# Patient Record
Sex: Female | Born: 1955 | Race: White | Hispanic: No | Smoking: Current every day smoker
Health system: Southern US, Community
[De-identification: ages and names within clinical notes are randomized; demographics above are authoritative.]

## PROBLEM LIST (undated history)

## (undated) DIAGNOSIS — F419 Anxiety disorder, unspecified: Secondary | ICD-10-CM

## (undated) DIAGNOSIS — F329 Major depressive disorder, single episode, unspecified: Secondary | ICD-10-CM

## (undated) DIAGNOSIS — F1910 Other psychoactive substance abuse, uncomplicated: Secondary | ICD-10-CM

## (undated) DIAGNOSIS — M549 Dorsalgia, unspecified: Secondary | ICD-10-CM

## (undated) DIAGNOSIS — F32A Depression, unspecified: Secondary | ICD-10-CM

## (undated) DIAGNOSIS — I10 Essential (primary) hypertension: Secondary | ICD-10-CM

## (undated) DIAGNOSIS — J449 Chronic obstructive pulmonary disease, unspecified: Secondary | ICD-10-CM

## (undated) HISTORY — PX: DILATION AND CURETTAGE OF UTERUS: SHX78

## (undated) HISTORY — DX: Chronic obstructive pulmonary disease, unspecified: J44.9

## (undated) HISTORY — DX: Depression, unspecified: F32.A

## (undated) HISTORY — DX: Other psychoactive substance abuse, uncomplicated: F19.10

## (undated) HISTORY — DX: Dorsalgia, unspecified: M54.9

## (undated) HISTORY — DX: Major depressive disorder, single episode, unspecified: F32.9

## (undated) HISTORY — DX: Anxiety disorder, unspecified: F41.9

---

## 2002-09-28 ENCOUNTER — Encounter: Payer: Self-pay | Admitting: Emergency Medicine

## 2002-09-28 ENCOUNTER — Emergency Department (HOSPITAL_COMMUNITY): Admission: EM | Admit: 2002-09-28 | Discharge: 2002-09-28 | Payer: Self-pay | Admitting: Emergency Medicine

## 2006-11-04 ENCOUNTER — Ambulatory Visit (HOSPITAL_COMMUNITY): Admission: RE | Admit: 2006-11-04 | Discharge: 2006-11-04 | Payer: Self-pay | Admitting: Family Medicine

## 2006-12-18 ENCOUNTER — Emergency Department (HOSPITAL_COMMUNITY): Admission: EM | Admit: 2006-12-18 | Discharge: 2006-12-18 | Payer: Self-pay | Admitting: Emergency Medicine

## 2006-12-22 ENCOUNTER — Emergency Department (HOSPITAL_COMMUNITY): Admission: EM | Admit: 2006-12-22 | Discharge: 2006-12-22 | Payer: Self-pay | Admitting: Emergency Medicine

## 2007-07-21 ENCOUNTER — Emergency Department (HOSPITAL_COMMUNITY): Admission: EM | Admit: 2007-07-21 | Discharge: 2007-07-22 | Payer: Self-pay | Admitting: Emergency Medicine

## 2008-02-19 ENCOUNTER — Emergency Department (HOSPITAL_COMMUNITY): Admission: EM | Admit: 2008-02-19 | Discharge: 2008-02-19 | Payer: Self-pay | Admitting: Emergency Medicine

## 2008-06-04 ENCOUNTER — Ambulatory Visit (HOSPITAL_COMMUNITY): Admission: RE | Admit: 2008-06-04 | Discharge: 2008-06-04 | Payer: Self-pay | Admitting: Internal Medicine

## 2008-06-07 ENCOUNTER — Ambulatory Visit (HOSPITAL_COMMUNITY): Admission: RE | Admit: 2008-06-07 | Discharge: 2008-06-07 | Payer: Self-pay | Admitting: Internal Medicine

## 2008-06-10 ENCOUNTER — Encounter (INDEPENDENT_AMBULATORY_CARE_PROVIDER_SITE_OTHER): Payer: Self-pay | Admitting: Internal Medicine

## 2008-06-10 ENCOUNTER — Ambulatory Visit (HOSPITAL_COMMUNITY): Admission: RE | Admit: 2008-06-10 | Discharge: 2008-06-10 | Payer: Self-pay | Admitting: Internal Medicine

## 2009-04-02 ENCOUNTER — Ambulatory Visit: Payer: Self-pay

## 2010-07-07 ENCOUNTER — Emergency Department (HOSPITAL_COMMUNITY)
Admission: EM | Admit: 2010-07-07 | Discharge: 2010-07-07 | Payer: Self-pay | Source: Home / Self Care | Admitting: Emergency Medicine

## 2010-09-14 ENCOUNTER — Ambulatory Visit (HOSPITAL_COMMUNITY)
Admission: RE | Admit: 2010-09-14 | Discharge: 2010-09-14 | Payer: Self-pay | Source: Home / Self Care | Admitting: Psychiatry

## 2011-01-17 ENCOUNTER — Encounter: Payer: Self-pay | Admitting: Internal Medicine

## 2011-03-24 ENCOUNTER — Encounter: Payer: Self-pay | Admitting: Physician Assistant

## 2011-03-24 DIAGNOSIS — F32A Depression, unspecified: Secondary | ICD-10-CM

## 2011-03-24 DIAGNOSIS — F329 Major depressive disorder, single episode, unspecified: Secondary | ICD-10-CM | POA: Insufficient documentation

## 2011-03-24 DIAGNOSIS — M549 Dorsalgia, unspecified: Secondary | ICD-10-CM

## 2011-03-24 DIAGNOSIS — G8929 Other chronic pain: Secondary | ICD-10-CM

## 2011-08-18 ENCOUNTER — Other Ambulatory Visit: Payer: Self-pay | Admitting: Family Medicine

## 2011-08-18 DIAGNOSIS — N63 Unspecified lump in unspecified breast: Secondary | ICD-10-CM

## 2011-08-18 DIAGNOSIS — N649 Disorder of breast, unspecified: Secondary | ICD-10-CM

## 2011-08-26 ENCOUNTER — Other Ambulatory Visit: Payer: Self-pay

## 2011-09-02 ENCOUNTER — Ambulatory Visit
Admission: RE | Admit: 2011-09-02 | Discharge: 2011-09-02 | Disposition: A | Discharge status: Home / Self Care | Payer: Medicaid Other | Source: Ambulatory Visit | Attending: Family Medicine | Admitting: Family Medicine

## 2011-09-02 DIAGNOSIS — N63 Unspecified lump in unspecified breast: Secondary | ICD-10-CM

## 2011-09-02 DIAGNOSIS — N649 Disorder of breast, unspecified: Secondary | ICD-10-CM

## 2011-09-17 LAB — CBC
HCT: 35.1 — ABNORMAL LOW
Hemoglobin: 12.1
MCHC: 34.5
RBC: 4.14
RDW: 16.7 — ABNORMAL HIGH

## 2011-09-17 LAB — DIFFERENTIAL
Basophils Absolute: 0.1
Basophils Relative: 1
Eosinophils Relative: 3
Lymphocytes Relative: 30
Monocytes Absolute: 0.8
Monocytes Relative: 8
Neutro Abs: 6

## 2011-09-17 LAB — BASIC METABOLIC PANEL
CO2: 27
Chloride: 108
GFR calc Af Amer: >60
Glucose, Bld: 96
Potassium: 4.5
Sodium: 139

## 2011-10-11 LAB — CBC
MCHC: 31.8
Platelets: 352
RDW: 19.6 — ABNORMAL HIGH

## 2011-10-11 LAB — DIFFERENTIAL
Basophils Absolute: 0.1
Basophils Relative: 1
Lymphocytes Relative: 28
Neutro Abs: 9.4 — ABNORMAL HIGH
Neutrophils Relative %: 61

## 2011-10-11 LAB — BASIC METABOLIC PANEL
CO2: 29
Calcium: 9.2
Creatinine, Ser: 2.28 — ABNORMAL HIGH
GFR calc non Af Amer: 23 — ABNORMAL LOW
Glucose, Bld: 83
Sodium: 141

## 2011-10-11 LAB — RAPID URINE DRUG SCREEN, HOSP PERFORMED
Amphetamines: NOT DETECTED
Cocaine: NOT DETECTED
Opiates: POSITIVE — AB
Tetrahydrocannabinol: NOT DETECTED

## 2011-10-11 LAB — ACETAMINOPHEN LEVEL: Acetaminophen (Tylenol), Serum: <10 — ABNORMAL LOW

## 2011-10-11 LAB — SALICYLATE LEVEL: Salicylate Lvl: <4

## 2012-06-07 ENCOUNTER — Emergency Department (HOSPITAL_COMMUNITY): Payer: Medicaid Other

## 2012-06-07 ENCOUNTER — Encounter (HOSPITAL_COMMUNITY): Payer: Self-pay | Admitting: Emergency Medicine

## 2012-06-07 ENCOUNTER — Emergency Department (HOSPITAL_COMMUNITY)
Admission: EM | Admit: 2012-06-07 | Discharge: 2012-06-07 | Disposition: A | Discharge status: Home / Self Care | Payer: Medicaid Other | Attending: Emergency Medicine | Admitting: Emergency Medicine

## 2012-06-07 DIAGNOSIS — F17200 Nicotine dependence, unspecified, uncomplicated: Secondary | ICD-10-CM | POA: Insufficient documentation

## 2012-06-07 DIAGNOSIS — F341 Dysthymic disorder: Secondary | ICD-10-CM | POA: Insufficient documentation

## 2012-06-07 DIAGNOSIS — Z79899 Other long term (current) drug therapy: Secondary | ICD-10-CM | POA: Insufficient documentation

## 2012-06-07 DIAGNOSIS — J4489 Other specified chronic obstructive pulmonary disease: Secondary | ICD-10-CM | POA: Insufficient documentation

## 2012-06-07 DIAGNOSIS — F1910 Other psychoactive substance abuse, uncomplicated: Secondary | ICD-10-CM | POA: Insufficient documentation

## 2012-06-07 DIAGNOSIS — J449 Chronic obstructive pulmonary disease, unspecified: Secondary | ICD-10-CM | POA: Insufficient documentation

## 2012-06-07 DIAGNOSIS — T450X5A Adverse effect of antiallergic and antiemetic drugs, initial encounter: Secondary | ICD-10-CM | POA: Insufficient documentation

## 2012-06-07 LAB — URINALYSIS, ROUTINE W REFLEX MICROSCOPIC
Bilirubin Urine: NEGATIVE
Glucose, UA: NEGATIVE mg/dL
Hgb urine dipstick: NEGATIVE
Specific Gravity, Urine: 1.017 (ref 1.005–1.030)
pH: 5.5 (ref 5.0–8.0)

## 2012-06-07 LAB — SALICYLATE LEVEL: Salicylate Lvl: <2 mg/dL — ABNORMAL LOW (ref 2.8–20.0)

## 2012-06-07 LAB — URINE MICROSCOPIC-ADD ON

## 2012-06-07 LAB — RAPID URINE DRUG SCREEN, HOSP PERFORMED
Amphetamines: NOT DETECTED
Benzodiazepines: POSITIVE — AB
Cocaine: NOT DETECTED
Opiates: NOT DETECTED
Tetrahydrocannabinol: NOT DETECTED

## 2012-06-07 LAB — COMPREHENSIVE METABOLIC PANEL
ALT: 52 U/L — ABNORMAL HIGH (ref 0–35)
AST: 37 U/L (ref 0–37)
Alkaline Phosphatase: 106 U/L (ref 39–117)
CO2: 26 mEq/L (ref 19–32)
Calcium: 9.4 mg/dL (ref 8.4–10.5)
Chloride: 103 mEq/L (ref 96–112)
GFR calc non Af Amer: 59 mL/min — ABNORMAL LOW (ref >90)
Glucose, Bld: 75 mg/dL (ref 70–99)
Potassium: 4.3 mEq/L (ref 3.5–5.1)
Sodium: 140 mEq/L (ref 135–145)

## 2012-06-07 LAB — CBC
Hemoglobin: 12.7 g/dL (ref 12.0–15.0)
MCH: 28 pg (ref 26.0–34.0)
Platelets: 282 10*3/uL (ref 150–400)
RBC: 4.53 MIL/uL (ref 3.87–5.11)
WBC: 9 10*3/uL (ref 4.0–10.5)

## 2012-06-07 MED ORDER — TRAZODONE HCL 150 MG PO TABS
150.0000 mg | ORAL_TABLET | Freq: Every day | ORAL | Status: DC
Start: 2012-06-07 — End: 2012-06-07
  Filled 2012-06-07: qty 1

## 2012-06-07 MED ORDER — CITALOPRAM HYDROBROMIDE 20 MG PO TABS
20.0000 mg | ORAL_TABLET | Freq: Every day | ORAL | Status: DC
  Administered 2012-06-07: 20 mg via ORAL
  Filled 2012-06-07: qty 1

## 2012-06-07 MED ORDER — FLUOXETINE HCL 20 MG PO CAPS: 60.0000 mg | ORAL_CAPSULE | Freq: Every day | ORAL | Status: DC

## 2012-06-07 MED ORDER — LORAZEPAM 1 MG PO TABS: 1.0000 mg | ORAL_TABLET | Freq: Once | ORAL | Status: DC

## 2012-06-07 MED ORDER — IBUPROFEN 200 MG PO TABS: 600.0000 mg | ORAL_TABLET | Freq: Three times a day (TID) | ORAL | Status: DC | PRN

## 2012-06-07 MED ORDER — LORAZEPAM 1 MG PO TABS: 1.0000 mg | ORAL_TABLET | Freq: Three times a day (TID) | ORAL | Status: DC | PRN

## 2012-06-07 NOTE — ED Notes (Signed)
Asked pt for urine sample. Said she was unable to go at the moment and will try later
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
## 2012-06-07 NOTE — ED Notes (Signed)
Pt requests detox from OTC sleep aid; pt sts she takes 5 pills TID because she sts they gave her energy; pt denies SI/HI; pt unsure of what ingredient is in sleep aid

## 2012-06-07 NOTE — Discharge Instructions (Signed)
Chemical Dependency Stop using the sleep aid.  Follow up with your doctor.  Return to the ED if you develop new or worsening symptoms. Chemical dependency is an addiction to drugs or alcohol. It is characterized by the repeated behavior of seeking out and using drugs and alcohol despite harmful consequences to the health and safety of ones self and others.  RISK FACTORS There are certain situations or behaviors that increase a person's risk for chemical dependency. These include:  A family history of chemical dependency.   A history of mental health issues, including depression and anxiety.   A home environment where drugs and alcohol are easily available to you.   Drug or alcohol use at a young age.  SYMPTOMS  The following symptoms can indicate chemical dependency:  Inability to limit the use of drugs or alcohol.   Nausea, sweating, shakiness, and anxiety that occurs when alcohol or drugs are not being used.   An increase in amount of drugs or alcohol that is necessary to get drunk or high.  People who experience these symptoms can assess their use of drugs and alcohol by asking themselves the following questions:  Have you been told by friends or family that they are worried about your use of alcohol or drugs?   Do friends and family ever tell you about things you did while drinking alcohol or using drugs that you do not remember?   Do you lie about using alcohol or drugs or about the amounts you use?   Do you have difficulty completing daily tasks unless you use alcohol or drugs?   Is the level of your work or school performance lower because of your drug or alcohol use?   Do you get sick from using drugs or alcohol but keep using anyway?   Do you feel uncomfortable in social situations unless you use alcohol or drugs?   Do you use drugs or alcohol to help forget problems?  An answer of yes to any of these questions may indicate chemical dependency. Professional evaluation  is suggested. Document Released: 12/07/2001 Document Revised: 12/02/2011 Document Reviewed: 02/18/2011 [facility] Patient Information 2012 [place], [place].

## 2012-06-07 NOTE — ED Notes (Signed)
Pt reports wants detox from OTC sleeping pill. States takes sleeping pill because it gives her energy. Pt denies SI/HI. Denies depression.

## 2012-06-07 NOTE — BH Assessment (Signed)
[facility] Assessment Progress Note      1730 Spoke with pt's live-in boyfriend, Altha Harm, who clarified some of her information.  Pt sees Rudi Heap at [facility].  Her Psychiatrist is Donnie Aho, at [facility], who is treating her with Prozac and Klonopin.  According to Mr. Delford Field, pt also takes Tramadol and Trazadone, prescribed by her PCP.  He further states that she eats "those Equate pills like M and M's and has been getting progressively worse with the addiction.  She has recently even forgotten her grandchildren's names."
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
## 2012-06-07 NOTE — ED Notes (Signed)
Tele-pysch consult completed

## 2012-06-07 NOTE — ED Notes (Signed)
Report given to [facility], RN on yellow.  PTAR to pick up the patient that is in that room-will move when room is clean.

## 2012-06-07 NOTE — ED Notes (Signed)
Tele-pysch monitor in room and ready for consult

## 2012-06-12 ENCOUNTER — Other Ambulatory Visit: Payer: Self-pay | Admitting: Physician Assistant

## 2012-06-12 ENCOUNTER — Ambulatory Visit
Admission: RE | Admit: 2012-06-12 | Discharge: 2012-06-12 | Disposition: A | Discharge status: Home / Self Care | Payer: Medicaid Other | Source: Ambulatory Visit | Attending: Physician Assistant | Admitting: Physician Assistant

## 2012-06-12 DIAGNOSIS — R1031 Right lower quadrant pain: Secondary | ICD-10-CM

## 2012-06-12 MED ORDER — IOHEXOL 300 MG/ML  SOLN
100.0000 mL | Freq: Once | INTRAMUSCULAR | Status: AC | PRN
  Administered 2012-06-12: 100 mL via INTRAVENOUS

## 2012-06-12 MED ORDER — IOHEXOL 300 MG/ML  SOLN
20.0000 mL | Freq: Once | INTRAMUSCULAR | Status: AC | PRN
  Administered 2012-06-12: 20 mL via ORAL

## 2012-06-30 ENCOUNTER — Encounter: Payer: Self-pay | Admitting: Obstetrics and Gynecology

## 2012-06-30 ENCOUNTER — Ambulatory Visit (INDEPENDENT_AMBULATORY_CARE_PROVIDER_SITE_OTHER): Payer: Medicaid Other | Admitting: Obstetrics and Gynecology

## 2012-06-30 VITALS — BP 124/76 | Temp 97.7°F | Resp 16 | Ht 61.0 in | Wt 189.0 lb

## 2012-06-30 DIAGNOSIS — IMO0002 Reserved for concepts with insufficient information to code with codable children: Secondary | ICD-10-CM

## 2012-06-30 DIAGNOSIS — R87810 Cervical high risk human papillomavirus (HPV) DNA test positive: Secondary | ICD-10-CM

## 2012-06-30 DIAGNOSIS — R109 Unspecified abdominal pain: Secondary | ICD-10-CM

## 2012-06-30 DIAGNOSIS — R87811 Vaginal high risk human papillomavirus (HPV) DNA test positive: Secondary | ICD-10-CM

## 2012-06-30 LAB — POCT URINALYSIS DIPSTICK
Glucose, UA: NEGATIVE
Nitrite, UA: NEGATIVE
Protein, UA: NEGATIVE
Urobilinogen, UA: NEGATIVE

## 2012-06-30 LAB — POCT URINE PREGNANCY: Preg Test, Ur: NEGATIVE

## 2012-06-30 NOTE — Progress Notes (Signed)
Patient ID: Makayla Lin, female   DOB: Feb 17, 1956, 56 y.o.   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 Previous colposcopy: normal exam without visible pathology . Prior cervical treatment: no treatment.  Procedure Details  The risks and benefits of the procedure and Written informed consent obtained.  Patient given 600 mg Ibuprofen.  Speculum placed in vagina and excellent visualization of cervix achieved, cervix swabbed x 3 with acetic acid solution.  Findings: Cervix: no visible lesions and no mosaicism; endocervical curettage performed. Vaginal inspection: normal without visible lesions. Vulvar colposcopy: normal mucosa without lesions.  Specimens: ECC  Complications: none. Pt c/o RLQ pain for a month.  Getting into and out of the car makes it worse.  No f/c/n/v.  No vaginal bleeding or cramping.    Pt still has her appendix  Plan: Specimens labelled and sent to Pathology. HRHPV ro 16 and 18 were negative Return to discuss Pathology results in 2 weeks. US@[place]   Melody Comas AMD

## 2012-06-30 NOTE — Progress Notes (Signed)
Last Pap: 04/2012   WNL: No........Marland KitchenEpithelial cell abnormality: Squamous cells..Atypical Squamous cells of undetermined significance (ASC-US) HPV detected. Regular Periods:no Contraception: None  Monthly Breast exam:yes Tetanus<56yrs:yes Nl.Bladder Function:yes Daily BMs:yes Healthy Diet:yes Calcium:yes Mammogram:yes Date of Mammogram: Pt states it was this yr but does not recall month or date Exercise:yes Have often Exercise: Everyday/ walks Seatbelt: yes Abuse at home: no Stressful work:no Sigmoid-colonoscopy: Never had Bone Density: No PCP: [facility] Change in PMH: None Change in [facility]: Paternal & mother and father passed of cancer  *C/O abd pain on right lower quadrant  *Pt gets Rx filled through her psychiatrist

## 2012-07-04 LAB — PATHOLOGY

## 2012-07-14 ENCOUNTER — Other Ambulatory Visit: Payer: Medicaid Other

## 2012-07-14 ENCOUNTER — Encounter: Payer: Medicaid Other | Admitting: Obstetrics and Gynecology

## 2012-07-25 ENCOUNTER — Other Ambulatory Visit: Payer: Self-pay | Admitting: Family Medicine

## 2012-07-25 DIAGNOSIS — Z1231 Encounter for screening mammogram for malignant neoplasm of breast: Secondary | ICD-10-CM

## 2012-08-07 ENCOUNTER — Ambulatory Visit
Admission: RE | Admit: 2012-08-07 | Discharge: 2012-08-07 | Disposition: A | Discharge status: Home / Self Care | Payer: Medicaid Other | Source: Ambulatory Visit | Attending: Neurology | Admitting: Neurology

## 2012-08-07 ENCOUNTER — Other Ambulatory Visit: Payer: Self-pay | Admitting: Neurology

## 2012-08-07 DIAGNOSIS — S0990XA Unspecified injury of head, initial encounter: Secondary | ICD-10-CM

## 2012-08-07 DIAGNOSIS — R413 Other amnesia: Secondary | ICD-10-CM

## 2012-09-04 ENCOUNTER — Ambulatory Visit: Payer: Medicaid Other

## 2012-09-18 ENCOUNTER — Ambulatory Visit: Payer: Medicaid Other

## 2013-02-06 ENCOUNTER — Encounter (HOSPITAL_COMMUNITY): Payer: Self-pay | Admitting: Emergency Medicine

## 2013-02-06 ENCOUNTER — Emergency Department (HOSPITAL_COMMUNITY)
Admission: EM | Admit: 2013-02-06 | Discharge: 2013-02-06 | Disposition: A | Discharge status: Home / Self Care | Payer: Medicaid Other | Source: Home / Self Care | Attending: Family Medicine | Admitting: Family Medicine

## 2013-02-06 DIAGNOSIS — Z23 Encounter for immunization: Secondary | ICD-10-CM

## 2013-02-06 DIAGNOSIS — S8010XA Contusion of unspecified lower leg, initial encounter: Secondary | ICD-10-CM

## 2013-02-06 DIAGNOSIS — S8012XA Contusion of left lower leg, initial encounter: Secondary | ICD-10-CM

## 2013-02-06 DIAGNOSIS — S20219A Contusion of unspecified front wall of thorax, initial encounter: Secondary | ICD-10-CM

## 2013-02-06 HISTORY — DX: Essential (primary) hypertension: I10

## 2013-02-06 MED ORDER — TETANUS-DIPHTH-ACELL PERTUSSIS 5-2.5-18.5 LF-MCG/0.5 IM SUSP
INTRAMUSCULAR | Status: AC
  Filled 2013-02-06: qty 0.5

## 2013-02-06 MED ORDER — TETANUS-DIPHTH-ACELL PERTUSSIS 5-2.5-18.5 LF-MCG/0.5 IM SUSP
0.5000 mL | Freq: Once | INTRAMUSCULAR | Status: AC
  Administered 2013-02-06: 0.5 mL via INTRAMUSCULAR

## 2013-02-06 NOTE — ED Notes (Signed)
Pt states that on 2/7 she fell flat on concreat c/o left leg pain and some abrasion noted to below the knee of left leg.   Pt is also c/o of sob for about a month with wheezing and chest pain off and on. Pt states that she had been around family member with pneumonia.   No hx of asthma.
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
## 2013-02-24 ENCOUNTER — Encounter (HOSPITAL_COMMUNITY): Payer: Self-pay | Admitting: Emergency Medicine

## 2013-02-24 ENCOUNTER — Emergency Department (HOSPITAL_COMMUNITY)
Admission: EM | Admit: 2013-02-24 | Discharge: 2013-02-24 | Disposition: A | Discharge status: Home / Self Care | Payer: Medicaid Other | Source: Home / Self Care | Attending: Emergency Medicine | Admitting: Emergency Medicine

## 2013-02-24 DIAGNOSIS — IMO0002 Reserved for concepts with insufficient information to code with codable children: Secondary | ICD-10-CM

## 2013-02-24 DIAGNOSIS — T148XXA Other injury of unspecified body region, initial encounter: Secondary | ICD-10-CM

## 2013-02-24 NOTE — ED Notes (Signed)
Pt is here for laceration to left thumb since 1115 today Straight laceration about 2cm on left thumb Had her tetanus shot about 2 weeks ago Bleeding controlled w/dressing  She is alert w/no signs of acute distress.

## 2013-02-24 NOTE — ED Notes (Signed)
Asked to waiting room to place a dressing on patients finger while she was waiting.  Patient has laceration to thumb.  Bleeding currently controled.  Bulky dressing placed on thumb.  Patient and husband made aware to let registration staff know if anything changed

## 2013-02-24 NOTE — ED Provider Notes (Signed)
Chief Complaint  Patient presents with  . Extremity Laceration    History of Present Illness:   Makayla Lin is a 57 year old female who lacerated her left thumb with a knife today at around 1:15 AM. The knife was clean and sharp. She lacerated the dorsum of the thumb, just distal to the inner phalangeal joint. She's able to fully extend the joint and fully flex. She denies any numbness or tingling. Her last tetanus shot was just a couple weeks ago.  Review of Systems:  Other than noted above, the patient denies any of the following symptoms: Systemic:  No fever or chills. Musculoskeletal:  No joint pain or decreased range of motion. Neuro:  No numbness, tingling, or weakness.  PMFSH:  Past medical history, family history, social history, meds, and allergies were reviewed.  Physical Exam:   Vital signs:  BP 167/103  Pulse 90  Temp(Src) 98.2 F (36.8 C) (Oral)  Resp 19  SpO2 100% Ext:  There is a 2 cm laceration on the dorsum of the thumb, just distal to the inner phalangeal joint on the dorsum. The tendon was not exposed. She able to fully extend and fully flex but does hurt to flex she has intact sensation of the tip of the thumb.  All other joints had a full ROM without pain.  Pulses were full.  Good capillary refill in all digits.  No edema. Neurological:  Alert and oriented.  No muscle weakness.  Sensation was intact to light touch.   Procedure: Verbal informed consent was obtained.  The patient was informed of the risks and benefits of the procedure and understands and accepts.  Identity of the patient was verified verbally and by wristband.   The laceration area described above was prepped with Betadine and saline  and anesthetized with 3 mL of 2% Xylocaine without epinephrine.  The wound was then closed as follows:  The wound edges were approximated with 5 simple, interrupted 5-0 nylon sutures.  There were no immediate complications, and the patient tolerated the procedure well.  The laceration was then cleansed, Bacitracin ointment was applied and a clean, dry pressure dressing was put on.   Assessment:  The encounter diagnosis was Laceration.  Plan:   1.  The following meds were prescribed:   Discharge Medication List as of 02/24/2013  1:47 PM     2.  The patient was instructed in wound care and pain control, and handouts were given. 3.  The patient was told to return in 14 days for suture removal or wound recheck or sooner if any sign of infection.     Reuben Likes, MD 02/24/13 [phone]

## 2013-03-13 ENCOUNTER — Ambulatory Visit: Payer: Self-pay | Admitting: Neurology

## 2013-03-29 ENCOUNTER — Telehealth: Payer: Self-pay | Admitting: Physician Assistant

## 2013-03-29 NOTE — Telephone Encounter (Signed)
Last reilff 03/13/13.  Pt has been told only #180/month.  Refill is WAY to early.  Sent back to pharmacy DENIED.

## 2013-04-10 ENCOUNTER — Telehealth: Payer: Self-pay | Admitting: Physician Assistant

## 2013-04-10 MED ORDER — TRAMADOL HCL 50 MG PO TABS: 50.0000 mg | ORAL_TABLET | Freq: Three times a day (TID) | ORAL | Status: DC | PRN

## 2013-04-10 NOTE — Telephone Encounter (Signed)
Last refill 03/12/13.  Last OV 11/20/12.  Tramadol 50mg   1-2 tabs Q8hr prn  #180. Need approval for controlled medication.

## 2013-04-10 NOTE — Telephone Encounter (Signed)
Approved.  #180+0. 

## 2013-04-10 NOTE — Telephone Encounter (Signed)
Medication refilled per protocol. 

## 2013-04-18 ENCOUNTER — Ambulatory Visit (INDEPENDENT_AMBULATORY_CARE_PROVIDER_SITE_OTHER): Payer: Medicaid Other | Admitting: Physician Assistant

## 2013-04-18 ENCOUNTER — Encounter: Payer: Self-pay | Admitting: Physician Assistant

## 2013-04-18 VITALS — BP 134/88 | HR 84 | Temp 98.3°F | Resp 18 | Ht 61.75 in | Wt 198.0 lb

## 2013-04-18 DIAGNOSIS — I10 Essential (primary) hypertension: Secondary | ICD-10-CM

## 2013-04-18 DIAGNOSIS — G8929 Other chronic pain: Secondary | ICD-10-CM

## 2013-04-18 DIAGNOSIS — M549 Dorsalgia, unspecified: Secondary | ICD-10-CM
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
## 2013-05-01 ENCOUNTER — Telehealth: Payer: Self-pay | Admitting: Physician Assistant

## 2013-05-02 NOTE — Telephone Encounter (Signed)
?   OK to Refill  

## 2013-05-03 MED ORDER — TRAMADOL HCL 50 MG PO TABS: 50.0000 mg | ORAL_TABLET | Freq: Three times a day (TID) | ORAL | Status: DC | PRN

## 2013-05-03 NOTE — Telephone Encounter (Signed)
Approved.  #180+0. 

## 2013-05-03 NOTE — Telephone Encounter (Signed)
Refill called out

## 2013-05-16 ENCOUNTER — Encounter: Payer: Medicaid Other | Admitting: Physician Assistant

## 2013-06-05 ENCOUNTER — Telehealth: Payer: Self-pay | Admitting: Physician Assistant

## 2013-06-06 MED ORDER — TRAMADOL HCL 50 MG PO TABS: 50.0000 mg | ORAL_TABLET | Freq: Three times a day (TID) | ORAL | Status: DC | PRN

## 2013-06-06 NOTE — Telephone Encounter (Signed)
Refill appropriate.  #180 called into pharmacy No refills

## 2013-07-30 ENCOUNTER — Telehealth: Payer: Self-pay | Admitting: Physician Assistant

## 2013-07-30 NOTE — Telephone Encounter (Signed)
At LOV 04/18/13 I did referral to pain clinic.  Did she go?  Pain Clinic to manage her pain.  I am NOT going to cont to Rx pain meds-to go to pain clinic.

## 2013-07-30 NOTE — Telephone Encounter (Signed)
Invalid phone number.  Unable to reach patient.  Had appt in May for pain clinic.

## 2013-07-30 NOTE — Telephone Encounter (Signed)
OK refill Tramadol??  Not sure what is being done based on last OV

## 2013-08-01 ENCOUNTER — Encounter: Payer: Medicaid Other | Admitting: Physician Assistant

## 2013-08-01 ENCOUNTER — Telehealth: Payer: Self-pay | Admitting: Family Medicine

## 2013-08-01 NOTE — Telephone Encounter (Signed)
Pt called about refill of Tramadol.  Is completely out of.  I mentioned she missed her CPE appt today.  Apparently she had forgotten about it.  I also asked her about her pain management provider.  She says she is seeing a therapist but not a pain doctor.  Referral Q shows pain mgmt appt in early May but patient states she doesn't know anything about that.  I rescheduled her appt for CPE next week.  Told her I will need to check with provider again about refill of pain medication.Marland Kitchen

## 2013-08-02 NOTE — Telephone Encounter (Signed)
No Pain meds now. NTBS.

## 2013-08-02 NOTE — Telephone Encounter (Signed)
Pt made aware no refill of pain med.  Will discuss with provider at appt on Monday

## 2013-08-06 ENCOUNTER — Encounter: Payer: Self-pay | Admitting: Physician Assistant

## 2013-08-06 ENCOUNTER — Ambulatory Visit (INDEPENDENT_AMBULATORY_CARE_PROVIDER_SITE_OTHER): Payer: Medicaid Other | Admitting: Physician Assistant

## 2013-08-06 VITALS — BP 142/96 | HR 88 | Temp 98.2°F | Resp 18 | Ht 60.75 in | Wt 203.0 lb

## 2013-08-06 DIAGNOSIS — F329 Major depressive disorder, single episode, unspecified: Secondary | ICD-10-CM

## 2013-08-06 DIAGNOSIS — Z0000 Encounter for general adult medical examination without abnormal findings: Secondary | ICD-10-CM

## 2013-08-06 DIAGNOSIS — G8929 Other chronic pain: Secondary | ICD-10-CM

## 2013-08-06 DIAGNOSIS — M549 Dorsalgia, unspecified: Secondary | ICD-10-CM

## 2013-08-06 DIAGNOSIS — I10 Essential (primary) hypertension: Secondary | ICD-10-CM

## 2013-08-06 DIAGNOSIS — F32A Depression, unspecified: Secondary | ICD-10-CM

## 2013-08-06 LAB — CBC WITH DIFFERENTIAL/PLATELET
Basophils Absolute: 0.1 10*3/uL (ref 0.0–0.1)
Basophils Relative: 1 % (ref 0–1)
Eosinophils Absolute: 0.2 10*3/uL (ref 0.0–0.7)
Eosinophils Relative: 2 % (ref 0–5)
HCT: 40.1 % (ref 36.0–46.0)
Lymphocytes Relative: 24 % (ref 12–46)
MCH: 27.7 pg (ref 26.0–34.0)
MCHC: 32.7 g/dL (ref 30.0–36.0)
MCV: 84.8 fL (ref 78.0–100.0)
Monocytes Absolute: 0.6 10*3/uL (ref 0.1–1.0)
RDW: 15.5 % (ref 11.5–15.5)

## 2013-08-06 MED ORDER — TRAMADOL HCL 50 MG PO TABS: 50.0000 mg | ORAL_TABLET | Freq: Three times a day (TID) | ORAL | Status: DC | PRN
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
## 2013-08-06 NOTE — Addendum Note (Signed)
Addended by: Donne Anon on: 08/06/2013 04:51 PM   Modules accepted: Orders

## 2013-08-06 NOTE — Progress Notes (Signed)
Medications checked against what patient said and pharmacy dispense list.  Medication list updated and corrected

## 2013-08-07 ENCOUNTER — Encounter: Payer: Self-pay | Admitting: Family Medicine

## 2013-08-07 LAB — COMPLETE METABOLIC PANEL WITH GFR
ALT: 36 U/L — ABNORMAL HIGH (ref 0–35)
AST: 28 U/L (ref 0–37)
Albumin: 4.3 g/dL (ref 3.5–5.2)
Alkaline Phosphatase: 112 U/L (ref 39–117)
Potassium: 4.5 mEq/L (ref 3.5–5.3)
Sodium: 138 mEq/L (ref 135–145)
Total Protein: 7.9 g/dL (ref 6.0–8.3)

## 2013-08-07 LAB — VITAMIN D 25 HYDROXY (VIT D DEFICIENCY, FRACTURES): Vit D, 25-Hydroxy: 45 ng/mL (ref 30–89)

## 2013-08-07 LAB — LIPID PANEL
Cholesterol: 189 mg/dL (ref 0–200)
Total CHOL/HDL Ratio: 2.5 Ratio
VLDL: 15 mg/dL (ref 0–40)

## 2013-09-03 ENCOUNTER — Telehealth: Payer: Self-pay | Admitting: Physician Assistant

## 2013-09-03 NOTE — Telephone Encounter (Signed)
Tramadol HCL 50 mg tab 1-2 q8 hours prn #180

## 2013-09-04 NOTE — Telephone Encounter (Signed)
Last refill 08/06/13  #90.  Last OV same date.  OK refill?  Has appt at pain clinic on 09/25/13!!

## 2013-09-04 NOTE — Telephone Encounter (Signed)
Tell her we will give # 120 --to last until appt at pain clinic. MUST go to appt at pain clinic. This amount MUST last her until that appt.

## 2013-09-04 NOTE — Telephone Encounter (Signed)
Refill called to pharmacy. Unable to reach patient

## 2013-11-01 ENCOUNTER — Ambulatory Visit (INDEPENDENT_AMBULATORY_CARE_PROVIDER_SITE_OTHER): Payer: Medicaid Other | Admitting: Physician Assistant

## 2013-11-01 ENCOUNTER — Encounter: Payer: Self-pay | Admitting: Physician Assistant

## 2013-11-01 VITALS — BP 152/86 | HR 84 | Temp 98.0°F | Resp 18 | Wt 207.0 lb

## 2013-11-01 DIAGNOSIS — I10 Essential (primary) hypertension: Secondary | ICD-10-CM

## 2013-11-01 MED ORDER — LOSARTAN POTASSIUM-HCTZ 50-12.5 MG PO TABS: 1.0000 | ORAL_TABLET | Freq: Every day | ORAL | Status: DC

## 2013-11-01 NOTE — Progress Notes (Signed)
   Patient ID: KASSIAH MCCRORY MRN: [id number], DOB: 1956/04/07, 57 y.o. Date of Encounter: 11/01/2013, 4:10 PM    Chief Complaint:  Chief Complaint  Patient presents with  . Hypertension     HPI: 57 y.o. year old white female reports that she came in today for a couple of reasons.  she says that she wanted to let me know that she has been going to the [facility] pain clinic. Also wanted to let me know that she is going to [facility] for her psychiatry medications and has been seeing a Veterinary surgeon.  Also she recently checked her blood pressure at [employer] and it was high.     Home Meds: See attached medication section for any medications that were entered at today's visit. The computer does not put those onto this list.The following list is a list of meds entered prior to today's visit.   Current Outpatient Prescriptions on File Prior to Visit  Medication Sig Dispense Refill  . ARIPiprazole (ABILIFY) 30 MG tablet Take 15 mg by mouth 2 (two) times daily.      . clonazePAM (KLONOPIN) 1 MG tablet Take 0.5 mg by mouth 2 (two) times daily as needed. For anxiety      . docusate sodium (COLACE) 100 MG capsule Take 100 mg by mouth daily.      Marland Kitchen FLUoxetine (PROZAC) 20 MG capsule Take 60 mg by mouth daily.       . Ginkgo Biloba (GINKOBA PO) Take by mouth daily.      . traMADol (ULTRAM) 50 MG tablet Take 1 tablet (50 mg total) by mouth every 8 (eight) hours as needed for pain.  90 tablet  0  . traZODone (DESYREL) 100 MG tablet Take 200 mg by mouth at bedtime.       No current facility-administered medications on file prior to visit.    Allergies: No Known Allergies    Review of Systems: See HPI for pertinent ROS. All other ROS negative.    Physical Exam: Blood pressure 152/86, pulse 84, temperature 98 F (36.7 C), temperature source Oral, resp. rate 18, weight 207 lb (93.895 kg)., Body mass index is 39.44 kg/(m^2). General:  WNWD WF. Appears in no acute distress. Neck: Supple. No thyromegaly.  No lymphadenopathy. No carotid bruits. Lungs: Clear bilaterally to auscultation without wheezes, rales, or rhonchi. Breathing is unlabored. Heart: Regular rhythm. No murmurs, rubs, or gallops. Msk:  Strength and tone normal for age. Extremities/Skin: Warm and dry. No clubbing or cyanosis. No edema. No rashes or suspicious lesions. Neuro: Alert and oriented X 3. Moves all extremities spontaneously. Gait is normal. CNII-XII grossly in tact. Psych:  Responds to questions appropriately with a normal affect.     ASSESSMENT AND PLAN:  57 y.o. year old female with  1. Hypertension I Reviewed her chart and saw that she had been on losartan HCTZ 50/12.5 in the past. However this is not on her current medication list. She has no idea what happened with this and did not intentionally stop the medication. She was not having adverse effects to the medicine. Therefore we will just restart this medication. - losartan-hydrochlorothiazide (HYZAAR) 50-12.5 MG per tablet; Take 1 tablet by mouth daily.  Dispense: 90 tablet; Refill: 3 She will need regular office visit and lab in 6 months.   Signed, [address] [place], [place], [facility] 11/01/2013 4:10 PM

## 2013-11-02 ENCOUNTER — Telehealth: Payer: Self-pay | Admitting: Family Medicine

## 2013-11-02 MED ORDER — HYDROCHLOROTHIAZIDE 12.5 MG PO CAPS: 12.5000 mg | ORAL_CAPSULE | Freq: Every day | ORAL | Status: DC

## 2013-11-02 MED ORDER — LOSARTAN POTASSIUM 50 MG PO TABS: 50.0000 mg | ORAL_TABLET | Freq: Every day | ORAL | Status: DC

## 2013-11-02 NOTE — Telephone Encounter (Signed)
Insurance will not approve combination Losartan/HCTZ 50/12.5.  Called pharmacy and told to fill separately as Losartan 50 mg QD #90 + 3 and HCTZ 12.5 mg QD #90 +3.  Medication list updated

## 2014-03-28 ENCOUNTER — Other Ambulatory Visit: Payer: Self-pay | Admitting: Family Medicine

## 2014-03-28 MED ORDER — LOSARTAN POTASSIUM 50 MG PO TABS: 50.0000 mg | ORAL_TABLET | Freq: Every day | ORAL | Status: DC

## 2014-03-28 MED ORDER — HYDROCHLOROTHIAZIDE 12.5 MG PO CAPS: 12.5000 mg | ORAL_CAPSULE | Freq: Every day | ORAL | Status: DC

## 2014-03-28 NOTE — Telephone Encounter (Signed)
[facility] called stating patient wants to transfer all her medications to them.  They are requesting refills on all medications.  I told them we do not manage her pain meds.  She has a pain doctor for that (Tramadol) She has 6 mth appt sched for 05/01/14.  Ok to send refills to new pharmacy??

## 2014-03-28 NOTE — Telephone Encounter (Signed)
BP meds refilled x 1 until next appt.  I called pharmacy and told them they need to find out who her other providers are and contact them for refills of her other meds.

## 2014-03-28 NOTE — Telephone Encounter (Signed)
She sees Psychiatry. THEY prescribe ALL psych meds.  DO NOT put my name on any meds except blood pressure meds!!!

## 2014-04-09 ENCOUNTER — Encounter: Payer: Self-pay | Admitting: Family Medicine

## 2014-04-09 ENCOUNTER — Ambulatory Visit (INDEPENDENT_AMBULATORY_CARE_PROVIDER_SITE_OTHER): Payer: Medicaid Other | Admitting: Family Medicine

## 2014-04-09 VITALS — BP 136/74 | HR 98 | Temp 97.2°F | Resp 18 | Ht 62.0 in | Wt 214.0 lb

## 2014-04-09 DIAGNOSIS — G629 Polyneuropathy, unspecified: Secondary | ICD-10-CM

## 2014-04-09 DIAGNOSIS — G609 Hereditary and idiopathic neuropathy, unspecified: Secondary | ICD-10-CM

## 2014-04-09 MED ORDER — GABAPENTIN 300 MG PO CAPS: 300.0000 mg | ORAL_CAPSULE | Freq: Three times a day (TID) | ORAL | Status: DC
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
## 2014-04-10 ENCOUNTER — Other Ambulatory Visit: Payer: Self-pay | Admitting: *Deleted

## 2014-04-10 DIAGNOSIS — G629 Polyneuropathy, unspecified: Secondary | ICD-10-CM

## 2014-04-10 LAB — TSH: TSH: 2.592 u[IU]/mL (ref 0.350–4.500)

## 2014-04-10 LAB — COMPLETE METABOLIC PANEL WITH GFR
ALK PHOS: 109 U/L (ref 39–117)
ALT: 30 U/L (ref 0–35)
AST: 21 U/L (ref 0–37)
Albumin: 3.9 g/dL (ref 3.5–5.2)
BUN: 17 mg/dL (ref 6–23)
CO2: 28 meq/L (ref 19–32)
CREATININE: 1.11 mg/dL — AB (ref 0.50–1.10)
Calcium: 9.6 mg/dL (ref 8.4–10.5)
Chloride: 104 mEq/L (ref 96–112)
GFR, EST AFRICAN AMERICAN: 64 mL/min
GFR, Est Non African American: 56 mL/min — ABNORMAL LOW
Glucose, Bld: 94 mg/dL (ref 70–99)
Potassium: 4.1 mEq/L (ref 3.5–5.3)
Sodium: 143 mEq/L (ref 135–145)
Total Bilirubin: 0.3 mg/dL (ref 0.2–1.2)
Total Protein: 7.3 g/dL (ref 6.0–8.3)

## 2014-04-10 LAB — HEMOGLOBIN A1C
HEMOGLOBIN A1C: 6 % — AB (ref <5.7)
Mean Plasma Glucose: 126 mg/dL — ABNORMAL HIGH (ref <117)

## 2014-04-10 LAB — VITAMIN B12: Vitamin B-12: 684 pg/mL (ref 211–911)

## 2014-04-10 MED ORDER — GABAPENTIN 300 MG PO CAPS: 300.0000 mg | ORAL_CAPSULE | Freq: Three times a day (TID) | ORAL | Status: DC

## 2014-05-01 ENCOUNTER — Ambulatory Visit: Payer: Medicaid Other | Admitting: Physician Assistant

## 2014-05-13 ENCOUNTER — Encounter: Payer: Self-pay | Admitting: Family Medicine

## 2014-05-13 ENCOUNTER — Ambulatory Visit (INDEPENDENT_AMBULATORY_CARE_PROVIDER_SITE_OTHER): Payer: Medicaid Other | Admitting: Family Medicine

## 2014-05-13 VITALS — BP 110/76 | HR 74 | Temp 98.3°F | Resp 18 | Ht 62.0 in | Wt 223.0 lb

## 2014-05-13 DIAGNOSIS — M722 Plantar fascial fibromatosis: Secondary | ICD-10-CM
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
## 2014-08-05 ENCOUNTER — Telehealth: Payer: Self-pay | Admitting: *Deleted

## 2014-08-05 NOTE — Telephone Encounter (Signed)
Received call from [facility] stating that pt is needing refill on trazadone and abilify and that pt is waiting now to get her prescriptions. Informed pharmacist that this has to be approved by provider first and that once reviewed and approved then we will call or fax script over, pharmacist says he will let pt know.  ?ok to refill both meds.

## 2014-08-05 NOTE — Telephone Encounter (Signed)
Her last office visit with me was 10/2013. At that office visit she told me she was going to a pain clinic and also was going to [facility] for her psychiatric meds. I have not been prescribing either one of these meds.  DENIED.

## 2014-08-06 NOTE — Telephone Encounter (Signed)
Pharmacy at [facility] is aware of denial

## 2014-08-21 ENCOUNTER — Encounter: Payer: Self-pay | Admitting: Physician Assistant

## 2014-08-21 ENCOUNTER — Ambulatory Visit (INDEPENDENT_AMBULATORY_CARE_PROVIDER_SITE_OTHER): Payer: Medicaid Other | Admitting: Physician Assistant

## 2014-08-21 VITALS — BP 130/78 | HR 82 | Temp 97.4°F | Resp 16 | Ht 61.0 in | Wt 222.0 lb

## 2014-08-21 DIAGNOSIS — R600 Localized edema: Secondary | ICD-10-CM

## 2014-08-21 DIAGNOSIS — R609 Edema, unspecified: Secondary | ICD-10-CM

## 2014-08-21 MED ORDER — HYDROCHLOROTHIAZIDE 25 MG PO TABS: 25.0000 mg | ORAL_TABLET | Freq: Every day | ORAL | Status: DC

## 2014-08-21 NOTE — Progress Notes (Signed)
Patient ID: Makayla Lin MRN: [id number], DOB: 07-Jun-1956, 58 y.o. Date of Encounter: 08/21/2014, 12:05 PM    Chief Complaint:  Chief Complaint  Patient presents with  . B Foot Edema    x3 days- 1+ pitting edema- states that B feet are extremely painful     HPI: 58 y.o. year old female says that she's been having problems with pain in her feet but now also for the past few days her feet are swelling. Says that she has not eaten any increased sodium in her diet recently compared to usual as far as she is aware.  She is having no increased shortness of breath. No orthopnea. No PND.     Home Meds:   Outpatient Prescriptions Prior to Visit  Medication Sig Dispense Refill  . ARIPiprazole (ABILIFY) 30 MG tablet Take 15 mg by mouth 2 (two) times daily.      . clonazePAM (KLONOPIN) 1 MG tablet Take 0.5 mg by mouth 2 (two) times daily as needed. For anxiety      . docusate sodium (COLACE) 100 MG capsule Take 100 mg by mouth daily.      Marland Kitchen FLUoxetine (PROZAC) 20 MG capsule Take 60 mg by mouth daily.       . Ginkgo Biloba (GINKOBA PO) Take by mouth daily.      Marland Kitchen losartan (COZAAR) 50 MG tablet Take 1 tablet (50 mg total) by mouth daily.  90 tablet  0  . traMADol (ULTRAM) 50 MG tablet Take 1 tablet (50 mg total) by mouth every 8 (eight) hours as needed for pain.  90 tablet  0  . traZODone (DESYREL) 100 MG tablet Take 200 mg by mouth at bedtime.      . hydrochlorothiazide (MICROZIDE) 12.5 MG capsule Take 1 capsule (12.5 mg total) by mouth daily.  90 capsule  0  . gabapentin (NEURONTIN) 300 MG capsule Take 1 capsule (300 mg total) by mouth 3 (three) times daily.  90 capsule  3   No facility-administered medications prior to visit.    Allergies: No Known Allergies    Review of Systems: See HPI for pertinent ROS. All other ROS negative.    Physical Exam: Blood pressure 130/78, pulse 82, temperature 97.4 F (36.3 C), temperature source Oral, resp. rate 16, height 5\' 1"  (1.549 m),  weight 222 lb (100.699 kg)., Body mass index is 41.97 kg/(m^2). General:  WF. Appears in no acute distress. Neck: Supple. No thyromegaly. No lymphadenopathy. Lungs: Clear bilaterally to auscultation without wheezes, rales, or rhonchi. Breathing is unlabored. Heart: Regular rhythm. No murmurs, rubs, or gallops. Msk:  Strength and tone normal for age. Extremities/Skin: Warm and dry. 1+ pitting edema of dorsum of feet to ankle level bilaterally.  Neuro: Alert and oriented X 3. Moves all extremities spontaneously. Gait is normal. CNII-XII grossly in tact. Psych:  Responds to questions appropriately with a normal affect.     ASSESSMENT AND PLAN:  58 y.o. year old female with  1. Bilateral lower extremity edema - hydrochlorothiazide (HYDRODIURIL) 25 MG tablet; Take 1 tablet (25 mg total) by mouth daily.  Dispense: 90 tablet; Refill: 3 - BASIC METABOLIC PANEL WITH GFR; Future  I see that she had an office visit with Dr. Dennard Schaumann 03/2014 with the diagnosis of peripheral neuropathy. I see that she had an office visit with Dr. Dennard Schaumann 04/2014 with a diagnosis of plantar fasciitis.  Today I am increasing her HCTZ from 12.5mg  to 25 mg daily. I have told her that  she needs to come in and have followup lab work in 2 weeks. I reviewed she did have CMET on 04/09/14 that was basically normal.  Signed, [address] [place], [place], [facility] 08/21/2014 12:05 PM

## 2014-08-26 ENCOUNTER — Other Ambulatory Visit: Payer: Self-pay | Admitting: Family Medicine

## 2014-09-09 ENCOUNTER — Telehealth: Payer: Self-pay | Admitting: *Deleted

## 2014-09-09 NOTE — Telephone Encounter (Signed)
Pt was seen couple weeks ago with edema in leg/foot, pt called stating she was taking hctz 0.5mg  and then was put on 12.5mg  and states it is not taking the edema at all, pt wants to know if you can prescribe her Lyrica instead. Please advise!  [employer].

## 2014-09-09 NOTE — Telephone Encounter (Signed)
Pt aware needs to be seen and has appt for Wednesday am at 830.

## 2014-09-09 NOTE — Telephone Encounter (Signed)
NTBS.

## 2014-09-11 ENCOUNTER — Ambulatory Visit (INDEPENDENT_AMBULATORY_CARE_PROVIDER_SITE_OTHER): Payer: Medicaid Other | Admitting: Physician Assistant

## 2014-09-11 ENCOUNTER — Encounter: Payer: Self-pay | Admitting: Physician Assistant

## 2014-09-11 VITALS — BP 120/80 | HR 78 | Temp 98.4°F | Ht 62.0 in | Wt 220.0 lb

## 2014-09-11 DIAGNOSIS — G8929 Other chronic pain: Secondary | ICD-10-CM

## 2014-09-11 DIAGNOSIS — G609 Hereditary and idiopathic neuropathy, unspecified: Secondary | ICD-10-CM

## 2014-09-11 DIAGNOSIS — M549 Dorsalgia, unspecified: Secondary | ICD-10-CM

## 2014-09-11 DIAGNOSIS — G629 Polyneuropathy, unspecified: Secondary | ICD-10-CM

## 2014-09-11 MED ORDER — PREGABALIN 50 MG PO CAPS: 50.0000 mg | ORAL_CAPSULE | Freq: Three times a day (TID) | ORAL | Status: DC

## 2014-09-11 NOTE — Progress Notes (Signed)
Patient ID: [name] MRN: [id number], DOB: 08-01-1956, 58 y.o. Date of Encounter: 09/11/2014, 9:30 AM    Chief Complaint:  Chief Complaint  Patient presents with  . Foot Swelling    per patient-  has been going on for last year, having daily pain.     HPI: 58 y.o. year old white female says that she is here because she is having pain in the bottoms of both feet--- not any swelling. She says that the fluid pill we prescribed in the past is taking care of the swelling but now she is here because she is still having severe pain in the bottoms of both feet.    I reviewed her chart and saw that she had an OV with Dr. Dennard Schaumann here at our office 04/09/2014 for peripheral neuropathy. The following is copied from that office visit note:   HPI  Patient reports one month of severe pain in both feet. She describes the pain as a pins and needles burning sensation. It is worse when she. It is worse when she stands for prolonged period time. Headaches and throbs at night. She denies any low back pain. She denies any lumbar radiculopathy. On examination today she has decreased 2. discrimination particularly worse in the left. She also has decreased sensation to vibration. Chest normal pulses in both feet. She has normal reflexes at the knee and the ankle. She has normal muscle strength in both legs. Neurological: She is alert and oriented to person, place, and time. She has normal reflexes. She displays normal reflexes. No cranial nerve deficit. She exhibits normal muscle tone. Coordination normal.  Assessment & Plan:   1. Peripheral neuropathy  Look for reversible causes of peripheral neuropathy with a TSH, vitamin B12, and hemoglobin A1c. Meanwhile start patient on gabapentin 300 mg by mouth 3 times a day and recheck her in 3 weeks. Consider MRI of the lumbar spine if no benefit from neurontin.  - COMPLETE METABOLIC PANEL WITH GFR  - TSH  - Hemoglobin A1c  - Vitamin B12  - gabapentin  (NEURONTIN) 300 MG capsule; Take 1 capsule (300 mg total) by mouth 3 (three) times daily. Dispense: 90 capsule; Refill: 3   TODAY: Today I reviewed her lab results and they were all normal. Today she reports the gabapentin has not been helping. She says that she used her friend's Lyrica and that it worked great--- and she is requesting Lyrica prescription.  I thought that I re-called she was seeing the pain clinic so I asked her about this. She says yes she does go to the pain clinic. Says her last visit there was last week and that she has another appointment with them today. I asked her if she discussed this pain with them and she says No- that it was too much commotion and too much chaos at that office. I asked her what type of pain they were treating -- also see that we have documented in history of chronic back pain. She says yes they do treat her back pain as well as headaches.  Reviewed in her chart that the last lumbar spine MRI in Epic was 06/07/2008. This did show abnormalities. She says that she has had MRIs since then. Says that she does have nerve pain down her legs.   Home Meds:   Outpatient Prescriptions Prior to Visit  Medication Sig Dispense Refill  . ARIPiprazole (ABILIFY) 30 MG tablet Take 15 mg by mouth 2 (two) times daily.      Marland Kitchen  clonazePAM (KLONOPIN) 1 MG tablet Take 0.5 mg by mouth 2 (two) times daily as needed. For anxiety      . docusate sodium (COLACE) 100 MG capsule Take 100 mg by mouth daily.      Marland Kitchen FLUoxetine (PROZAC) 20 MG capsule Take 60 mg by mouth daily.       . Ginkgo Biloba (GINKOBA PO) Take by mouth daily.      . hydrochlorothiazide (HYDRODIURIL) 25 MG tablet Take 1 tablet (25 mg total) by mouth daily.  90 tablet  3  . losartan (COZAAR) 50 MG tablet Take 1 tablet (50 mg total) by mouth daily.  90 tablet  0  . traZODone (DESYREL) 100 MG tablet Take 200 mg by mouth at bedtime.      . gabapentin (NEURONTIN) 300 MG capsule TAKE 1 CAPSULE (300 MG TOTAL) BY MOUTH  THREE   (THREE) TIMES DAILY.  90 capsule  5  . traMADol (ULTRAM) 50 MG tablet Take 1 tablet (50 mg total) by mouth every 8 (eight) hours as needed for pain.  90 tablet  0   No facility-administered medications prior to visit.    Allergies: No Known Allergies    Review of Systems: See HPI for pertinent ROS. All other ROS negative.    Physical Exam: Blood pressure 120/80, pulse 78, temperature 98.4 F (36.9 C), temperature source Oral, height 5\' 2"  (1.575 m), weight 220 lb (99.791 kg)., Body mass index is 40.23 kg/(m^2). General:  Overweight WF. Appears in no acute distress. Neck: Supple. No thyromegaly. No lymphadenopathy. Lungs: Clear bilaterally to auscultation without wheezes, rales, or rhonchi. Breathing is unlabored. Heart: Regular rhythm. No murmurs, rubs, or gallops. Msk:  Strength and tone normal for age. Extremities/Skin: Warm and dry. No edema. . No rashes. Neuro: Alert and oriented X 3. Moves all extremities spontaneously. Gait is normal. CNII-XII grossly in tact. 2+ patella reflexes bilaterally.  Psych:  Responds to questions appropriately with a normal affect.     ASSESSMENT AND PLAN:  58 y.o. year old female with  1. Peripheral neuropathy See outline of prior assessment of this in HPI Discussed with her that the Neurontin dose can be increased and that an increased dose of Neurontin may control her symptoms. However, she is set on changing to Lyrica. I have told her that she needs to inform the pain clinic of today's visit and this medication change when she goes to the pain clinic today. As I told her the pain clinic in followup this. - pregabalin (LYRICA) 50 MG capsule; Take 1 capsule (50 mg total) by mouth 3 (three) times daily.  Dispense: 90 capsule; Refill: 0  2. Chronic back pain    Signed, Makayla Lin, [place], [facility] 09/11/2014 9:30 AM

## 2014-10-10 ENCOUNTER — Telehealth: Payer: Self-pay | Admitting: Physician Assistant

## 2014-10-10 NOTE — Telephone Encounter (Signed)
Patient is calling to get for refill on lyrica, and she would like to increase the amount if possible  [phone]

## 2014-10-11 ENCOUNTER — Other Ambulatory Visit: Payer: Self-pay | Admitting: Family Medicine

## 2014-10-11 DIAGNOSIS — G629 Polyneuropathy, unspecified: Secondary | ICD-10-CM

## 2014-10-11 MED ORDER — PREGABALIN 50 MG PO CAPS: 50.0000 mg | ORAL_CAPSULE | Freq: Three times a day (TID) | ORAL | Status: DC

## 2014-10-11 NOTE — Telephone Encounter (Signed)
Med called to pharm 

## 2014-10-11 NOTE — Telephone Encounter (Signed)
Called [facility] on [address] and cancelled the additional refills originally put on the lyrica rx I had called in earlier.

## 2014-10-11 NOTE — Telephone Encounter (Signed)
I called patient.  She did not go to the pain management appointment she told she had the day of last OV.  She said "something came up"  I asked her when next appt was and was told the 5 th of this month.  I reinforced what was told to her by provider.  She needs to keep her pain mgmt appt.  She needs to let them know of her medication change and she needs to let them handle this.  This comes under her pain management.  She stated understanding.  Told her one refill of Lyrica to be sent at original dose until she can get to her pain mgmt appt.  No further refills from [place].  After note:  Pt called this morning and spoke to another providers nurse.  One refill with two additional refills was called in by them.  She did not mention this to me when I called her.  Also note the patient pharmacy listed is [facility].  Fax RX request received yesterday for Lyrica came from some unknown pharmacy ([facility])  Patient had other nurse call RX to [facility] on [address].  We have called and canceled the additional refills.

## 2014-11-13 ENCOUNTER — Other Ambulatory Visit: Payer: Medicaid Other | Admitting: Physician Assistant

## 2014-11-14 ENCOUNTER — Telehealth: Payer: Self-pay | Admitting: Physician Assistant

## 2014-11-14 NOTE — Telephone Encounter (Signed)
[phone]  Pt is needing a refill on pregabalin (LYRICA) 50 MG capsule  [facility] [place], [place] - [address]

## 2014-11-14 NOTE — Telephone Encounter (Signed)
This was just refilled last month at another pharmacy for #90 +2.

## 2014-11-25 ENCOUNTER — Other Ambulatory Visit: Payer: Medicaid Other | Admitting: Physician Assistant

## 2014-12-02 ENCOUNTER — Telehealth: Payer: Self-pay | Admitting: Family Medicine

## 2014-12-02 NOTE — Telephone Encounter (Signed)
Refill Abilify denied.  Per provider gets from her [facility] provider.

## 2014-12-09 ENCOUNTER — Telehealth: Payer: Self-pay | Admitting: Family Medicine

## 2014-12-09 NOTE — Telephone Encounter (Signed)
Pt to get Abilify from [facility] Provider.  Returned denied by [place]

## 2014-12-13 ENCOUNTER — Telehealth: Payer: Self-pay | Admitting: Physician Assistant

## 2014-12-13 NOTE — Telephone Encounter (Signed)
Patient is calling to see if she can get referral to a podiatrist if possible, she says mary beth gave her some medication but it is no better  Please call her at [phone]

## 2014-12-16 ENCOUNTER — Ambulatory Visit (INDEPENDENT_AMBULATORY_CARE_PROVIDER_SITE_OTHER): Payer: Medicaid Other | Admitting: Physician Assistant

## 2014-12-16 ENCOUNTER — Encounter: Payer: Self-pay | Admitting: Physician Assistant

## 2014-12-16 VITALS — BP 128/86 | HR 80 | Temp 97.9°F | Resp 18 | Wt 221.0 lb

## 2014-12-16 DIAGNOSIS — G629 Polyneuropathy, unspecified: Secondary | ICD-10-CM

## 2014-12-16 DIAGNOSIS — M549 Dorsalgia, unspecified: Secondary | ICD-10-CM

## 2014-12-16 DIAGNOSIS — G8929 Other chronic pain: Secondary | ICD-10-CM

## 2014-12-16 MED ORDER — PREGABALIN 100 MG PO CAPS: 100.0000 mg | ORAL_CAPSULE | Freq: Three times a day (TID) | ORAL | Status: DC

## 2014-12-16 NOTE — Telephone Encounter (Signed)
Called patient and appt scheduled.

## 2014-12-16 NOTE — Progress Notes (Signed)
Patient ID: Makayla Lin MRN: [id number], DOB: [birth year], 57 y.o. Date of Encounter: 12/16/2014, 11:20 AM    Chief Complaint:  Chief Complaint  Patient presents with  . foot pain     HPI: 58 y.o. year old white female says that she is here because she is having pain in the bottoms of both feet--- not any swelling. She says that the fluid pill we prescribed in the past is taking care of the swelling but now she is here because she is still having severe pain in the bottoms of both feet.    I reviewed her chart and saw that she had an OV with Dr. Dennard Schaumann here at our office 04/09/2014 for peripheral neuropathy. The following is copied from that office visit note:   HPI  Patient reports one month of severe pain in both feet. She describes the pain as a pins and needles burning sensation. It is worse when she. It is worse when she stands for prolonged period time. Headaches and throbs at night. She denies any low back pain. She denies any lumbar radiculopathy. On examination today she has decreased 2. discrimination particularly worse in the left. She also has decreased sensation to vibration. Chest normal pulses in both feet. She has normal reflexes at the knee and the ankle. She has normal muscle strength in both legs. Neurological: She is alert and oriented to person, place, and time. She has normal reflexes. She displays normal reflexes. No cranial nerve deficit. She exhibits normal muscle tone. Coordination normal.  Assessment & Plan:   1. Peripheral neuropathy  Look for reversible causes of peripheral neuropathy with a TSH, vitamin B12, and hemoglobin A1c. Meanwhile start patient on gabapentin 300 mg by mouth 3 times a day and recheck her in 3 weeks. Consider MRI of the lumbar spine if no benefit from neurontin.  - COMPLETE METABOLIC PANEL WITH GFR  - TSH  - Hemoglobin A1c  - Vitamin B12  - gabapentin (NEURONTIN) 300 MG capsule; Take 1 capsule (300 mg total) by mouth 3 (three)  times daily. Dispense: 90 capsule; Refill: 3     THE FOLLOWING IS COPIED FROM MY OV NOTE 09/11/2014:   Today I reviewed his lab results and they were all normal. Today she reports the gabapentin has not been helping. She says that she used her friend's Lyrica and that it worked great--- and she is requesting Lyrica prescription.  I thought that I re-called she was seeing the pain clinic so I asked her about this. She says yes she does go to the pain clinic. Says her last visit there was last week and that she has another appointment with them today. I asked her if she discussed this pain with them and she says No- that it was too much commotion and too much chaos at that office. I asked her what type of pain they were treating -- also see that we have documented in history of chronic back pain. She says yes they do treat her back pain as well as headaches.  Reviewed in her chart that the last lumbar spine MRI in Epic was 06/07/2008. This did show abnormalities. She says that she has had MRIs since then. Says that she does have nerve pain down her legs.  ASSESSMENT/PLAN: 1. Peripheral neuropathy See outline of prior assessment of this in HPI Discussed with her that the Neurontin dose can be increased and that an increased dose of Neurontin may control her symptoms. However, she is  set on changing to Lyrica. I have told her that she needs to inform the pain clinic of today's visit and this medication change when she goes to the pain clinic today. As I told her the pain clinic in followup this. - pregabalin (LYRICA) 50 MG capsule; Take 1 capsule (50 mg total) by mouth 3 (three) times daily.  Dispense: 90 capsule; Refill: 0  2. Chronic back pain   TODAY--12/2/12015:   Today she says the Lyrica has helped so much.  However, is wondering if can increase the dose. Still having discomfort in bottoms of feet.   Says when she went to Pain Clinic, she reported the pain in her feet and reported the  Lyrica but they ignored it. Said "it seemed like since it wasn't about my headache or back pain, they just acted like I didn't even say anything."    Home Meds:   Outpatient Prescriptions Prior to Visit  Medication Sig Dispense Refill  . ARIPiprazole (ABILIFY) 30 MG tablet Take 15 mg by mouth 2 (two) times daily.    . clonazePAM (KLONOPIN) 1 MG tablet Take 0.5 mg by mouth 2 (two) times daily as needed. For anxiety    . docusate sodium (COLACE) 100 MG capsule Take 100 mg by mouth daily.    Marland Kitchen FLUoxetine (PROZAC) 20 MG capsule Take 60 mg by mouth daily.     . Ginkgo Biloba (GINKOBA PO) Take by mouth daily.    . hydrochlorothiazide (HYDRODIURIL) 25 MG tablet Take 1 tablet (25 mg total) by mouth daily. 90 tablet 3  . losartan (COZAAR) 50 MG tablet Take 1 tablet (50 mg total) by mouth daily. 90 tablet 0  . traMADol (ULTRAM) 50 MG tablet Take 1 tablet (50 mg total) by mouth every 8 (eight) hours as needed for pain. 90 tablet 0  . traZODone (DESYREL) 100 MG tablet Take 200 mg by mouth at bedtime.    . pregabalin (LYRICA) 50 MG capsule Take 1 capsule (50 mg total) by mouth 3 (three) times daily. 90 capsule 2   No facility-administered medications prior to visit.    Allergies: No Known Allergies    Review of Systems: See HPI for pertinent ROS. All other ROS negative.    Physical Exam: Blood pressure 128/86, pulse 80, temperature 97.9 F (36.6 C), temperature source Oral, resp. rate 18, weight 221 lb (100.245 kg)., Body mass index is 40.41 kg/(m^2). General:  Overweight WF. Appears in no acute distress. Neck: Supple. No thyromegaly. No lymphadenopathy. Lungs: Clear bilaterally to auscultation without wheezes, rales, or rhonchi. Breathing is unlabored. Heart: Regular rhythm. No murmurs, rubs, or gallops. Msk:  Strength and tone normal for age. Extremities/Skin: Warm and dry. No edema. . No rashes. Neuro: Alert and oriented X 3. Moves all extremities spontaneously. Gait is normal. CNII-XII  grossly in tact. 2+ patella reflexes bilaterally. Feet: Inspection normal. 2 + DP and Pt pulses bilaterally. Sensation intact.   Psych:  Responds to questions appropriately with a normal affect.     ASSESSMENT AND PLAN:  58 y.o. year old female with  1. Peripheral neuropathy See outline of prior assessment of this in HPI Told her to gradually increas dose of Lyrica. Start with increasint the night time dose to 100mg , continue 50mg  dose at other times of day. Gradually increase dose at other times of day.  F/U if any adverse effects.   - pregabalin (LYRICA) 100 MG capsule; Take 1 capsule (100 mg total) by mouth 3 (three) times daily.  Dispense:  90 capsule; Refill: 3  2. Chronic back pain    Signed, [address] [place], [place], [facility] 12/16/2014 11:20 AM

## 2014-12-16 NOTE — Telephone Encounter (Signed)
Tell her I do not think Podiatry is the appropriate specialist to see.  Tell her to schedule OV here for [place] to further evaluate.

## 2014-12-24 ENCOUNTER — Other Ambulatory Visit: Payer: Self-pay | Admitting: Physician Assistant

## 2014-12-25 ENCOUNTER — Telehealth: Payer: Self-pay | Admitting: Family Medicine

## 2014-12-25 NOTE — Telephone Encounter (Signed)
Have submitted PA through [employer].  [id number] # [id number] W  Awaiting response

## 2014-12-26 NOTE — Telephone Encounter (Signed)
Rec'd approval for Losartan 50 mg for one year.  PA# [id number].Marland Kitchen Good thru 12/25/2015  Approval faxed to pharmacy

## 2015-03-12 ENCOUNTER — Other Ambulatory Visit: Payer: Self-pay | Admitting: Physician Assistant

## 2015-03-13 NOTE — Telephone Encounter (Signed)
Medication dose changed on 12/16/2014 to Lyrica 100mg  PO TID.   Refill denied.   Pharmacy does state that patient has refills on Lyrica 100mg .

## 2015-03-18 ENCOUNTER — Ambulatory Visit: Payer: Medicaid Other | Admitting: Family Medicine

## 2015-03-31 ENCOUNTER — Encounter: Payer: Self-pay | Admitting: Physician Assistant

## 2015-03-31 ENCOUNTER — Ambulatory Visit (INDEPENDENT_AMBULATORY_CARE_PROVIDER_SITE_OTHER): Payer: Medicaid Other | Admitting: Physician Assistant

## 2015-03-31 VITALS — BP 162/90 | HR 96 | Temp 98.6°F | Resp 20 | Wt 219.0 lb

## 2015-03-31 DIAGNOSIS — G8929 Other chronic pain: Secondary | ICD-10-CM | POA: Diagnosis not present

## 2015-03-31 DIAGNOSIS — G629 Polyneuropathy, unspecified: Secondary | ICD-10-CM

## 2015-03-31 DIAGNOSIS — I10 Essential (primary) hypertension: Secondary | ICD-10-CM | POA: Diagnosis not present

## 2015-03-31 DIAGNOSIS — M549 Dorsalgia, unspecified: Secondary | ICD-10-CM

## 2015-03-31 DIAGNOSIS — F32A Depression, unspecified: Secondary | ICD-10-CM

## 2015-03-31 DIAGNOSIS — F411 Generalized anxiety disorder: Secondary | ICD-10-CM | POA: Diagnosis not present

## 2015-03-31 DIAGNOSIS — F329 Major depressive disorder, single episode, unspecified: Secondary | ICD-10-CM | POA: Diagnosis not present

## 2015-03-31 MED ORDER — LOSARTAN POTASSIUM 100 MG PO TABS: 100.0000 mg | ORAL_TABLET | Freq: Every day | ORAL | Status: DC

## 2015-03-31 MED ORDER — CLONAZEPAM 1 MG PO TABS
0.5000 mg | ORAL_TABLET | Freq: Two times a day (BID) | ORAL | Status: DC | PRN
Start: 2015-03-31 — End: 2015-12-18

## 2015-03-31 MED ORDER — PREGABALIN 100 MG PO CAPS: 100.0000 mg | ORAL_CAPSULE | Freq: Three times a day (TID) | ORAL | Status: DC

## 2015-04-01 NOTE — Progress Notes (Signed)
Patient ID: Makayla Lin MRN: [id number], DOB: 1956/04/09, 59 y.o. Date of Encounter: 04/01/2015, 10:45 AM    Chief Complaint:  Chief Complaint  Patient presents with  . foot pain/swelling  . Medication Refill  . discuss weight loss     HPI: 59 y.o. year old white female here for the above.   I reviewed her chart and saw that she had an OV with Dr. Dennard Schaumann here at our office 04/09/2014 for peripheral neuropathy. The following is copied from that office visit note:   HPI  Patient reports one month of severe pain in both feet. She describes the pain as a pins and needles burning sensation. It is worse when she. It is worse when she stands for prolonged period time. Headaches and throbs at night. She denies any low back pain. She denies any lumbar radiculopathy. On examination today she has decreased 2. discrimination particularly worse in the left. She also has decreased sensation to vibration. Chest normal pulses in both feet. She has normal reflexes at the knee and the ankle. She has normal muscle strength in both legs. Neurological: She is alert and oriented to person, place, and time. She has normal reflexes. She displays normal reflexes. No cranial nerve deficit. She exhibits normal muscle tone. Coordination normal.  Assessment & Plan:   1. Peripheral neuropathy  Look for reversible causes of peripheral neuropathy with a TSH, vitamin B12, and hemoglobin A1c. Meanwhile start patient on gabapentin 300 mg by mouth 3 times a day and recheck her in 3 weeks. Consider MRI of the lumbar spine if no benefit from neurontin.  - COMPLETE METABOLIC PANEL WITH GFR  - TSH  - Hemoglobin A1c  - Vitamin B12  - gabapentin (NEURONTIN) 300 MG capsule; Take 1 capsule (300 mg total) by mouth 3 (three) times daily. Dispense: 90 capsule; Refill: 3     THE FOLLOWING IS COPIED FROM MY OV NOTE 09/11/2014:   Today I reviewed his lab results and they were all normal. Today she reports the gabapentin has  not been helping. She says that she used her friend's Lyrica and that it worked great--- and she is requesting Lyrica prescription.  I thought that I re-called she was seeing the pain clinic so I asked her about this. She says yes she does go to the pain clinic. Says her last visit there was last week and that she has another appointment with them today. I asked her if she discussed this pain with them and she says No- that it was too much commotion and too much chaos at that office. I asked her what type of pain they were treating -- also see that we have documented in history of chronic back pain. She says yes they do treat her back pain as well as headaches.  Reviewed in her chart that the last lumbar spine MRI in [facility] was 06/07/2008. This did show abnormalities. She says that she has had MRIs since then. Says that she does have nerve pain down her legs.  ASSESSMENT/PLAN: 1. Peripheral neuropathy See outline of prior assessment of this in HPI Discussed with her that the Neurontin dose can be increased and that an increased dose of Neurontin may control her symptoms. However, she is set on changing to Lyrica. I have told her that she needs to inform the pain clinic of today's visit and this medication change when she goes to the pain clinic today. As I told her the pain clinic in followup this. -  pregabalin (LYRICA) 50 MG capsule; Take 1 capsule (50 mg total) by mouth 3 (three) times daily.  Dispense: 90 capsule; Refill: 0  2. Chronic back pain   THE FOLLOWING IS COPIED FROM MY OV NOTE--12/2/12015:   Today she says the Lyrica has helped so much.  However, is wondering if can increase the dose. Still having discomfort in bottoms of feet.   Says when she went to Pain Clinic, she reported the pain in her feet and reported the Lyrica but they ignored it. Said "it seemed like since it wasn't about my headache or back pain, they just acted like I didn't even say anything."  ASSESSMENT/PLAN: 1.  Peripheral neuropathy See outline of prior assessment of this in HPI Told her to gradually increas dose of Lyrica. Start with increasint the night time dose to 100mg , continue 50mg  dose at other times of day. Gradually increase dose at other times of day.  F/U if any adverse effects.   - pregabalin (LYRICA) 100 MG capsule; Take 1 capsule (100 mg total) by mouth 3 (three) times daily.  Dispense: 90 capsule; Refill: 3  03/31/2015:  Today she is requesting a refill on her Klonopin. I discussed with her that she needs to be getting all psych medicines from psych. She asked if she can please have a refill just to hold her over until she can follow-up with them. Says that her son is 32 years old and has diabetes but he does not treat it at all and he has severe neuropathy and had stepped on something and didn't even know it and she is afraid he is going to lose his foot. A lot of stress going on. I told her that her blood pressure is high today and asked to visit been high at other doctor's checkups. She says yes it had. She is also needing refills on Lyrica. Says that pain management is not prescribing that medicine that I am.     Home Meds:   Outpatient Prescriptions Prior to Visit  Medication Sig Dispense Refill  . docusate sodium (COLACE) 100 MG capsule Take 100 mg by mouth daily.    Marland Kitchen FLUoxetine (PROZAC) 20 MG capsule Take 60 mg by mouth daily.     . Ginkgo Biloba (GINKOBA PO) Take by mouth daily.    . hydrochlorothiazide (MICROZIDE) 12.5 MG capsule TAKE 1 TABLET BY MOUTH ONCE A DAY 90 capsule 1  . traMADol (ULTRAM) 50 MG tablet Take 1 tablet (50 mg total) by mouth every 8 (eight) hours as needed for pain. 90 tablet 0  . traZODone (DESYREL) 100 MG tablet Take 200 mg by mouth at bedtime.    . clonazePAM (KLONOPIN) 1 MG tablet Take 0.5 mg by mouth 2 (two) times daily as needed. For anxiety    . hydrochlorothiazide (HYDRODIURIL) 25 MG tablet Take 1 tablet (25 mg total) by mouth daily. 90 tablet  3  . losartan (COZAAR) 50 MG tablet TAKE 1 TABLET BY MOUTH ONCE A DAY 90 tablet 1  . pregabalin (LYRICA) 100 MG capsule Take 1 capsule (100 mg total) by mouth 3 (three) times daily. 90 capsule 3  . ARIPiprazole (ABILIFY) 30 MG tablet Take 15 mg by mouth 2 (two) times daily.     No facility-administered medications prior to visit.    Allergies: No Known Allergies    Review of Systems: See HPI for pertinent ROS. All other ROS negative.    Physical Exam: Blood pressure 162/90, pulse 96, temperature 98.6 F (37 C),  temperature source Oral, resp. rate 20, weight 219 lb (99.338 kg)., Body mass index is 40.05 kg/(m^2). General:  Overweight WF. Appears in no acute distress. Neck: Supple. No thyromegaly. No lymphadenopathy. Lungs: Clear bilaterally to auscultation without wheezes, rales, or rhonchi. Breathing is unlabored. Heart: Regular rhythm. No murmurs, rubs, or gallops. Msk:  Strength and tone normal for age. Extremities/Skin: Warm and dry. No edema. . No rashes. Neuro: Alert and oriented X 3. Moves all extremities spontaneously. Gait is normal. CNII-XII grossly in tact. 2+ patella reflexes bilaterally. Feet: Inspection normal. 2 + DP and Pt pulses bilaterally. Sensation intact.   Psych:  Responds to questions appropriately with a normal affect.     ASSESSMENT AND PLAN:  59 y.o. year old female with  1. Generalized anxiety disorder - clonazePAM (KLONOPIN) 1 MG tablet; Take 0.5 tablets (0.5 mg total) by mouth 2 (two) times daily as needed. For anxiety  Dispense: 30 tablet; Refill: 0  NO FURTHER FUTURE REFILLS--SHE IS TO F/U WITH PSYCH AND GET ALL PSYCH MEDS FROM PSYCH  2. Depression Per psychiatry 3. Chronic back pain Per pain clinic 4. Essential hypertension Increase Cozaar from 50 to 100 mg. Office visit in 2 weeks to recheck blood pressure and BMET. - losartan (COZAAR) 100 MG tablet; Take 1 tablet (100 mg total) by mouth daily.  Dispense: 30 tablet; Refill: 0  5. Peripheral  neuropathy - pregabalin (LYRICA) 100 MG capsule; Take 1 capsule (100 mg total) by mouth 3 (three) times daily.  Dispense: 90 capsule; Refill: 3   F/U office visit 2 weeks to recheck blood pressure and bmet.  Signed, [address] [place], [place], [facility] 04/01/2015 10:45 AM

## 2015-04-14 ENCOUNTER — Telehealth: Payer: Self-pay | Admitting: Family Medicine

## 2015-04-14 NOTE — Telephone Encounter (Signed)
PA for Lorsartan has been denied.  Please advise?

## 2015-04-14 NOTE — Telephone Encounter (Signed)
PA requested for Losartan 100mg  through [facility].  Conf # [id number] W.

## 2015-04-15 NOTE — Telephone Encounter (Signed)
She had been on losartan 50mg . I just increased dose at LOV. Will  They pay for two of the 50mg  per day? Losartan 50mg  2 po QD.  If not, do they cover other ARBs?

## 2015-04-16 ENCOUNTER — Ambulatory Visit (INDEPENDENT_AMBULATORY_CARE_PROVIDER_SITE_OTHER): Payer: Medicaid Other | Admitting: Physician Assistant

## 2015-04-16 ENCOUNTER — Encounter: Payer: Self-pay | Admitting: Physician Assistant

## 2015-04-16 VITALS — BP 114/74 | HR 96 | Temp 98.1°F | Resp 18 | Wt 218.0 lb

## 2015-04-16 DIAGNOSIS — I10 Essential (primary) hypertension: Secondary | ICD-10-CM

## 2015-04-16 LAB — BASIC METABOLIC PANEL WITH GFR
BUN: 17 mg/dL (ref 6–23)
CALCIUM: 9.9 mg/dL (ref 8.4–10.5)
CO2: 27 mEq/L (ref 19–32)
CREATININE: 0.96 mg/dL (ref 0.50–1.10)
Chloride: 101 mEq/L (ref 96–112)
GFR, Est African American: 76 mL/min
GFR, Est Non African American: 66 mL/min
GLUCOSE: 121 mg/dL — AB (ref 70–99)
Potassium: 3.8 mEq/L (ref 3.5–5.3)
Sodium: 140 mEq/L (ref 135–145)

## 2015-04-16 NOTE — Progress Notes (Signed)
Patient ID: NAKEMA FAKE MRN: [id number], DOB: [birth year], 59 y.o. Date of Encounter: 04/16/2015, 10:56 AM    Chief Complaint:  Chief Complaint  Patient presents with  . 2 week follow up    chg BP med     HPI: 59 y.o. year old white female here for the above.   Her last office visit, 03/31/15, blood pressure was elevated. Increased losartan from 50 mg to 100 mg. Had her follow-up office visit 2 weeks so she is here today for follow-up. That she has been taking 2 of the 50 mg losartan since her last visit. Says that she is having no adverse effects. Says that she is feeling just fine.    I reviewed her chart and saw that she had an OV with Dr. Dennard Schaumann here at our office 04/09/2014 for peripheral neuropathy. The following is copied from that office visit note:   HPI  Patient reports one month of severe pain in both feet. She describes the pain as a pins and needles burning sensation. It is worse when she. It is worse when she stands for prolonged period time. Headaches and throbs at night. She denies any low back pain. She denies any lumbar radiculopathy. On examination today she has decreased 2. discrimination particularly worse in the left. She also has decreased sensation to vibration. Chest normal pulses in both feet. She has normal reflexes at the knee and the ankle. She has normal muscle strength in both legs. Neurological: She is alert and oriented to person, place, and time. She has normal reflexes. She displays normal reflexes. No cranial nerve deficit. She exhibits normal muscle tone. Coordination normal.  Assessment & Plan:   1. Peripheral neuropathy  Look for reversible causes of peripheral neuropathy with a TSH, vitamin B12, and hemoglobin A1c. Meanwhile start patient on gabapentin 300 mg by mouth 3 times a day and recheck her in 3 weeks. Consider MRI of the lumbar spine if no benefit from neurontin.  - COMPLETE METABOLIC PANEL WITH GFR  - TSH  - Hemoglobin A1c  - Vitamin  B12  - gabapentin (NEURONTIN) 300 MG capsule; Take 1 capsule (300 mg total) by mouth 3 (three) times daily. Dispense: 90 capsule; Refill: 3     THE FOLLOWING IS COPIED FROM MY OV NOTE 09/11/2014:   Today I reviewed his lab results and they were all normal. Today she reports the gabapentin has not been helping. She says that she used her friend's Lyrica and that it worked great--- and she is requesting Lyrica prescription.  I thought that I re-called she was seeing the pain clinic so I asked her about this. She says yes she does go to the pain clinic. Says her last visit there was last week and that she has another appointment with them today. I asked her if she discussed this pain with them and she says No- that it was too much commotion and too much chaos at that office. I asked her what type of pain they were treating -- also see that we have documented in history of chronic back pain. She says yes they do treat her back pain as well as headaches.  Reviewed in her chart that the last lumbar spine MRI in [facility] was 06/07/2008. This did show abnormalities. She says that she has had MRIs since then. Says that she does have nerve pain down her legs.  ASSESSMENT/PLAN: 1. Peripheral neuropathy See outline of prior assessment of this in HPI Discussed with her  that the Neurontin dose can be increased and that an increased dose of Neurontin may control her symptoms. However, she is set on changing to Lyrica. I have told her that she needs to inform the pain clinic of today's visit and this medication change when she goes to the pain clinic today. As I told her the pain clinic in followup this. - pregabalin (LYRICA) 50 MG capsule; Take 1 capsule (50 mg total) by mouth 3 (three) times daily.  Dispense: 90 capsule; Refill: 0  2. Chronic back pain   THE FOLLOWING IS COPIED FROM MY OV NOTE--12/2/12015:   Today she says the Lyrica has helped so much.  However, is wondering if can increase the dose. Still  having discomfort in bottoms of feet.   Says when she went to Pain Clinic, she reported the pain in her feet and reported the Lyrica but they ignored it. Said "it seemed like since it wasn't about my headache or back pain, they just acted like I didn't even say anything."  ASSESSMENT/PLAN: 1. Peripheral neuropathy See outline of prior assessment of this in HPI Told her to gradually increas dose of Lyrica. Start with increasint the night time dose to 100mg , continue 50mg  dose at other times of day. Gradually increase dose at other times of day.  F/U if any adverse effects.   - pregabalin (LYRICA) 100 MG capsule; Take 1 capsule (100 mg total) by mouth 3 (three) times daily.  Dispense: 90 capsule; Refill: 3  03/31/2015:  Today she is requesting a refill on her Klonopin. I discussed with her that she needs to be getting all psych medicines from psych. She asked if she can please have a refill just to hold her over until she can follow-up with them. Says that her son is 24 years old and has diabetes but he does not treat it at all and he has severe neuropathy and had stepped on something and didn't even know it and she is afraid he is going to lose his foot. A lot of stress going on. I told her that her blood pressure is high today and asked to visit been high at other doctor's checkups. She says yes it had. She is also needing refills on Lyrica. Says that pain management is not prescribing that medicine that I am.  04/16/15: She is taking losartan 100 mg daily. No adverse effects.   Home Meds:   Outpatient Prescriptions Prior to Visit  Medication Sig Dispense Refill  . ARIPiprazole (ABILIFY) 15 MG tablet Take 15 mg by mouth 2 (two) times daily.    . clonazePAM (KLONOPIN) 1 MG tablet Take 0.5 tablets (0.5 mg total) by mouth 2 (two) times daily as needed. For anxiety 30 tablet 0  . docusate sodium (COLACE) 100 MG capsule Take 100 mg by mouth daily.    Marland Kitchen FLUoxetine (PROZAC) 20 MG capsule Take 60  mg by mouth daily.     . Ginkgo Biloba (GINKOBA PO) Take by mouth daily.    . hydrochlorothiazide (MICROZIDE) 12.5 MG capsule TAKE 1 TABLET BY MOUTH ONCE A DAY 90 capsule 1  . losartan (COZAAR) 100 MG tablet Take 1 tablet (100 mg total) by mouth daily. 30 tablet 0  . pregabalin (LYRICA) 100 MG capsule Take 1 capsule (100 mg total) by mouth 3 (three) times daily. 90 capsule 3  . traMADol (ULTRAM) 50 MG tablet Take 1 tablet (50 mg total) by mouth every 8 (eight) hours as needed for pain. 90 tablet 0  .  traZODone (DESYREL) 100 MG tablet Take 200 mg by mouth at bedtime.     No facility-administered medications prior to visit.    Allergies: No Known Allergies    Review of Systems: See HPI for pertinent ROS. All other ROS negative.    Physical Exam: Blood pressure 114/74, pulse 96, temperature 98.1 F (36.7 C), temperature source Oral, resp. rate 18, weight 218 lb (98.884 kg)., Body mass index is 39.86 kg/(m^2). General:  Overweight WF. Appears in no acute distress. Neck: Supple. No thyromegaly. No lymphadenopathy. Lungs: Clear bilaterally to auscultation without wheezes, rales, or rhonchi. Breathing is unlabored. Heart: Regular rhythm. No murmurs, rubs, or gallops. Msk:  Strength and tone normal for age. Extremities/Skin: Warm and dry. No edema. . No rashes. Neuro: Alert and oriented X 3. Moves all extremities spontaneously. Gait is normal. CNII-XII grossly in tact. 2+ patella reflexes bilaterally. Feet: Inspection normal. 2 + DP and Pt pulses bilaterally. Sensation intact.   Psych:  Responds to questions appropriately with a normal affect.     ASSESSMENT AND PLAN:  59 y.o. year old female with   Essential hypertension Blood Pressure is now at goal. Continue current medications. Continue losartan 100 mg daily. Check BMET to monitor labs on increased dose.   Generalized anxiety disorder At visit 03/31/15 I prescribed----- clonazePAM (KLONOPIN) 1 MG tablet; Take 0.5 tablets (0.5 mg  total) by mouth 2 (two) times daily as needed. For anxiety  Dispense: 30 tablet; Refill: 0 And noted: NO FURTHER FUTURE REFILLS--SHE IS TO F/U WITH PSYCH AND GET ALL PSYCH MEDS FROM PSYCH  2. Depression Per psychiatry 3. Chronic back pain Per pain clinic 5. Peripheral neuropathy - pregabalin (LYRICA) 100 MG capsule; Take 1 capsule (100 mg total) by mouth 3 (three) times daily.  Dispense: 90 capsule; Refill: 3   Follow-up with patient once we get results of bmet. If this lab is normal then can plan for routine follow-up visit in 6 months. Follow-up sooner if needed.  Signed, [address] [place], [place], [facility] 04/16/2015 10:56 AM

## 2015-04-16 NOTE — Telephone Encounter (Signed)
Re submitted PA for Losartan again today through [facility] [employer].  This time approved  Conf # [id number]   Approval # [id number]  Approved for one year.  PA faxed to pharmacy.

## 2015-05-16 ENCOUNTER — Telehealth: Payer: Self-pay | Admitting: Physician Assistant

## 2015-05-16 NOTE — Telephone Encounter (Signed)
LRF 03/31/15 #30  LOV 04/16/15  OK refill?

## 2015-05-16 NOTE — Telephone Encounter (Signed)
Patient calling for refill of klonopin to [facility] [phone]

## 2015-05-19 NOTE — Telephone Encounter (Signed)
They must not think that medication is appropriate treatment or they would prescribe it. I am NOT prescribing this.  DENIED.

## 2015-05-19 NOTE — Telephone Encounter (Signed)
Gave her 1 prescription of this at her office visit 04/01/15. At that visit I told her that I would not give her any refills on this medication or any other psych medications and that she was to continue to follow-up with psychiatry for any and all of her psychiatry medications. DENIED.

## 2015-05-19 NOTE — Telephone Encounter (Signed)
She told her mental health provider that and they told her to get from [place].  She does not see psychiatrist and counselors can not write prescriptions.

## 2015-05-20 NOTE — Telephone Encounter (Signed)
LMTCB

## 2015-05-22 NOTE — Telephone Encounter (Signed)
Pt told refill denied.  She can contact her mental health provider and have her send letter here to provider requesting we refill her medications if they will not.

## 2015-05-28 ENCOUNTER — Other Ambulatory Visit: Payer: Self-pay | Admitting: Physician Assistant

## 2015-05-28 NOTE — Telephone Encounter (Signed)
Ok to refill??  Last office visit 04/16/2015.  Last refill 4/4/20126.

## 2015-05-29 NOTE — Telephone Encounter (Signed)
Letter received from [facility]. Reviewed that that therapist did feel that the patient would benefit from continuing clonazepam. However, I also reviewed that the patient sees a psychiatrist who prescribes her psych medications. She is also on Abilify and Prozac. The psychiatrist feels that she does need to continue the clonazepam then it would make sense for them to be a once to prescribe it--not me. I have forwarded this information to [place] to follow-up with this.

## 2015-06-02 NOTE — Telephone Encounter (Signed)
Spoke to pt's boyfriend on HIPAA and informed him that [facility] will not refill the Clonazepam and that is shuld be prescribe by her psychiatrist since they are the ones who prescribed it. He stated not to worry about it and they will get intouch with [facility] and she if they can prescribe it. Pt boyfriend stated that she is wanting to come off of it anyways and will probably do that.

## 2015-06-06 ENCOUNTER — Other Ambulatory Visit: Payer: Self-pay | Admitting: Family Medicine

## 2015-06-06 NOTE — Telephone Encounter (Signed)
Refill appropriate and filled per protocol. 

## 2015-07-01 ENCOUNTER — Other Ambulatory Visit: Payer: Self-pay | Admitting: Physician Assistant

## 2015-07-01 ENCOUNTER — Other Ambulatory Visit: Payer: Self-pay | Admitting: Family Medicine

## 2015-07-01 DIAGNOSIS — G629 Polyneuropathy, unspecified: Secondary | ICD-10-CM

## 2015-07-01 NOTE — Telephone Encounter (Signed)
Medication refilled per protocol. 

## 2015-07-01 NOTE — Telephone Encounter (Signed)
LRF 03/31/15 #90 + 3.  LOV 04/16/15  Ok refill?

## 2015-07-02 MED ORDER — PREGABALIN 100 MG PO CAPS: 100.0000 mg | ORAL_CAPSULE | Freq: Three times a day (TID) | ORAL | Status: DC

## 2015-07-02 NOTE — Telephone Encounter (Signed)
Approved.  #90+ 6 additional refills

## 2015-07-02 NOTE — Telephone Encounter (Signed)
Medication refilled per protocol. 

## 2015-07-03 ENCOUNTER — Encounter: Payer: Self-pay | Admitting: Physician Assistant

## 2015-07-03 ENCOUNTER — Ambulatory Visit (INDEPENDENT_AMBULATORY_CARE_PROVIDER_SITE_OTHER): Payer: Medicaid Other | Admitting: Physician Assistant

## 2015-07-03 ENCOUNTER — Other Ambulatory Visit: Payer: Self-pay | Admitting: Family Medicine

## 2015-07-03 VITALS — BP 110/88 | HR 82 | Temp 97.7°F | Resp 18 | Wt 230.0 lb

## 2015-07-03 DIAGNOSIS — R600 Localized edema: Secondary | ICD-10-CM

## 2015-07-03 DIAGNOSIS — G629 Polyneuropathy, unspecified: Secondary | ICD-10-CM | POA: Diagnosis not present

## 2015-07-03 DIAGNOSIS — F411 Generalized anxiety disorder: Secondary | ICD-10-CM

## 2015-07-03 DIAGNOSIS — F32A Depression, unspecified: Secondary | ICD-10-CM

## 2015-07-03 DIAGNOSIS — I10 Essential (primary) hypertension: Secondary | ICD-10-CM

## 2015-07-03 DIAGNOSIS — M549 Dorsalgia, unspecified: Secondary | ICD-10-CM

## 2015-07-03 DIAGNOSIS — G8929 Other chronic pain: Secondary | ICD-10-CM | POA: Diagnosis not present

## 2015-07-03 DIAGNOSIS — F329 Major depressive disorder, single episode, unspecified: Secondary | ICD-10-CM | POA: Diagnosis not present

## 2015-07-03 MED ORDER — HYDROCHLOROTHIAZIDE 25 MG PO TABS: 25.0000 mg | ORAL_TABLET | Freq: Every day | ORAL | Status: DC

## 2015-07-03 NOTE — Progress Notes (Signed)
Patient ID: Makayla Lin MRN: [id number], DOB: [birth year], 59 y.o. Date of Encounter: 07/03/2015, 4:11 PM    Chief Complaint:  Chief Complaint  Patient presents with  . Medication Refill    refill on Klonopin,lyrica, ablify, HCTZ, and losartan  . Foot Swelling    bilateral, right worse, heels also swelling     HPI: 59 y.o. year old white female here for the above.   Her last office visit, 03/31/15, blood pressure was elevated. Increased losartan from 50 mg to 100 mg. Had her follow-up office visit 2 weeks so she is here today for follow-up. That she has been taking 2 of the 50 mg losartan since her last visit. Says that she is having no adverse effects. Says that she is feeling just fine.    I reviewed her chart and saw that she had an OV with Dr. Dennard Schaumann here at our office 04/09/2014 for peripheral neuropathy. The following is copied from that office visit note:   HPI  Patient reports one month of severe pain in both feet. She describes the pain as a pins and needles burning sensation. It is worse when she. It is worse when she stands for prolonged period time. Headaches and throbs at night. She denies any low back pain. She denies any lumbar radiculopathy. On examination today she has decreased 2. discrimination particularly worse in the left. She also has decreased sensation to vibration. Chest normal pulses in both feet. She has normal reflexes at the knee and the ankle. She has normal muscle strength in both legs. Neurological: She is alert and oriented to person, place, and time. She has normal reflexes. She displays normal reflexes. No cranial nerve deficit. She exhibits normal muscle tone. Coordination normal.  Assessment & Plan:   1. Peripheral neuropathy  Look for reversible causes of peripheral neuropathy with a TSH, vitamin B12, and hemoglobin A1c. Meanwhile start patient on gabapentin 300 mg by mouth 3 times a day and recheck her in 3 weeks. Consider MRI of the lumbar  spine if no benefit from neurontin.  - COMPLETE METABOLIC PANEL WITH GFR  - TSH  - Hemoglobin A1c  - Vitamin B12  - gabapentin (NEURONTIN) 300 MG capsule; Take 1 capsule (300 mg total) by mouth 3 (three) times daily. Dispense: 90 capsule; Refill: 3     THE FOLLOWING IS COPIED FROM MY OV NOTE 09/11/2014:   Today I reviewed his lab results and they were all normal. Today she reports the gabapentin has not been helping. She says that she used her friend's Lyrica and that it worked great--- and she is requesting Lyrica prescription.  I thought that I re-called she was seeing the pain clinic so I asked her about this. She says yes she does go to the pain clinic. Says her last visit there was last week and that she has another appointment with them today. I asked her if she discussed this pain with them and she says No- that it was too much commotion and too much chaos at that office. I asked her what type of pain they were treating -- also see that we have documented in history of chronic back pain. She says yes they do treat her back pain as well as headaches.  Reviewed in her chart that the last lumbar spine MRI in [facility] was 06/07/2008. This did show abnormalities. She says that she has had MRIs since then. Says that she does have nerve pain down her legs.  ASSESSMENT/PLAN:  1. Peripheral neuropathy See outline of prior assessment of this in HPI Discussed with her that the Neurontin dose can be increased and that an increased dose of Neurontin may control her symptoms. However, she is set on changing to Lyrica. I have told her that she needs to inform the pain clinic of today's visit and this medication change when she goes to the pain clinic today. As I told her the pain clinic in followup this. - pregabalin (LYRICA) 50 MG capsule; Take 1 capsule (50 mg total) by mouth 3 (three) times daily.  Dispense: 90 capsule; Refill: 0  2. Chronic back pain   THE FOLLOWING IS COPIED FROM MY OV  NOTE--12/2/12015:   Today she says the Lyrica has helped so much.  However, is wondering if can increase the dose. Still having discomfort in bottoms of feet.   Says when she went to Pain Clinic, she reported the pain in her feet and reported the Lyrica but they ignored it. Said "it seemed like since it wasn't about my headache or back pain, they just acted like I didn't even say anything."  ASSESSMENT/PLAN: 1. Peripheral neuropathy See outline of prior assessment of this in HPI Told her to gradually increas dose of Lyrica. Start with increasint the night time dose to 100mg , continue 50mg  dose at other times of day. Gradually increase dose at other times of day.  F/U if any adverse effects.   - pregabalin (LYRICA) 100 MG capsule; Take 1 capsule (100 mg total) by mouth 3 (three) times daily.  Dispense: 90 capsule; Refill: 3  03/31/2015:  Today she is requesting a refill on her Klonopin. I discussed with her that she needs to be getting all psych medicines from psych. She asked if she can please have a refill just to hold her over until she can follow-up with them. Says that her son is 45 years old and has diabetes but he does not treat it at all and he has severe neuropathy and had stepped on something and didn't even know it and she is afraid he is going to lose his foot. A lot of stress going on. I told her that her blood pressure is high today and asked to visit been high at other doctor's checkups. She says yes it had. She is also needing refills on Lyrica. Says that pain management is not prescribing that medicine that I am.  04/16/15: She is taking losartan 100 mg daily. No adverse effects.   07/03/2015:  She states that she has been having swelling in her feet recently. Says she is taken HCTZ 12.5 daily.  She continues to take losartan daily with no adverse effects. She is still taking the Lyrica as directed and this is helping a lot and she is wanting to continue this. No other  specific complaints or concerns today other than the swelling. However she does repeatedly ask me to refill her Klonopin.   Home Meds:   Outpatient Prescriptions Prior to Visit  Medication Sig Dispense Refill  . ARIPiprazole (ABILIFY) 15 MG tablet Take 15 mg by mouth 2 (two) times daily.    . clonazePAM (KLONOPIN) 1 MG tablet Take 0.5 tablets (0.5 mg total) by mouth 2 (two) times daily as needed. For anxiety 30 tablet 0  . docusate sodium (COLACE) 100 MG capsule Take 100 mg by mouth daily.    Marland Kitchen FLUoxetine (PROZAC) 20 MG capsule Take 60 mg by mouth daily.     Marland Kitchen gabapentin (NEURONTIN) 300 MG capsule  TAKE 1 CAPSULE (300 MG TOTAL) BY MOUTH THREE TIMES DAILY. 90 capsule 1  . Ginkgo Biloba (GINKOBA PO) Take by mouth daily.    . hydrochlorothiazide (MICROZIDE) 12.5 MG capsule TAKE 1 TABLET BY MOUTH ONCE A DAY 90 capsule 0  . losartan (COZAAR) 100 MG tablet Take 1 tablet (100 mg total) by mouth daily. 30 tablet 0  . pregabalin (LYRICA) 100 MG capsule Take 1 capsule (100 mg total) by mouth 3 (three) times daily. 90 capsule 5  . traMADol (ULTRAM) 50 MG tablet Take 1 tablet (50 mg total) by mouth every 8 (eight) hours as needed for pain. 90 tablet 0  . traZODone (DESYREL) 100 MG tablet Take 200 mg by mouth at bedtime.     No facility-administered medications prior to visit.    Allergies: No Known Allergies    Review of Systems: See HPI for pertinent ROS. All other ROS negative.    Physical Exam: Blood pressure 110/88, pulse 82, temperature 97.7 F (36.5 C), temperature source Oral, resp. rate 18, weight 230 lb (104.327 kg)., Body mass index is 42.06 kg/(m^2). General:  Overweight WF. Appears in no acute distress. Neck: Supple. No thyromegaly. No lymphadenopathy. Lungs: Clear bilaterally to auscultation without wheezes, rales, or rhonchi. Breathing is unlabored. Heart: Regular rhythm. No murmurs, rubs, or gallops. Msk:  Strength and tone normal for age. Extremities/Skin: She has pitting edema  of feet up to ankle level bilaterally. 2+ edema at ankle level.  Neuro: Alert and oriented X 3. Moves all extremities spontaneously. Gait is normal. CNII-XII grossly in tact. 2+ patella reflexes bilaterally. Feet: Inspection normal. 2 + DP and Pt pulses bilaterally. Sensation intact.   Psych:  Responds to questions appropriately with a normal affect.     ASSESSMENT AND PLAN:  59 y.o. year old female with    Bilateral edema of lower extremity Needs sodium restriction. If not controlled with HCTZ 25mg , will consider TED hose. At OV 07/03/15 HCTZ increased from 12.5 to 25 mg daily. Reviewed that at lab 04/16/15-- potassium was 3.8. At that time she was on HCTZ 12.5 mg. - hydrochlorothiazide (HYDRODIURIL) 25 MG tablet; Take 1 tablet (25 mg total) by mouth daily.  Dispense: 30 tablet; Refill: 5    Essential hypertension Blood Pressure is now at goal. Continue current medications. Continue losartan 100 mg daily. BMET ---Normal 04/16/2015   Generalized anxiety disorder At visit 07/03/2015--I gave her no prescription for Klonopin and told her that I will not refill this medication and will not prescribe any psychiatry medicines. NO FURTHER FUTURE REFILLS--SHE IS TO F/U WITH PSYCH AND GET ALL PSYCH MEDS FROM [facility]   Depression Per psychiatry  Chronic back pain Per pain clinic  Peripheral neuropathy Will cont Lyrica. Pain Clinic aware we are prescribing this.  - pregabalin (LYRICA) 100 MG capsule; Take 1 capsule (100 mg total) by mouth 3 (three) times daily.  Dispense: 90 capsule; Refill: 3     Signed, [address] [place], [place], [facility] 07/03/2015 4:11 PM

## 2015-07-04 ENCOUNTER — Other Ambulatory Visit: Payer: Self-pay | Admitting: Physician Assistant

## 2015-07-07 NOTE — Telephone Encounter (Signed)
Medication refilled per protocol. 

## 2015-07-18 ENCOUNTER — Telehealth: Payer: Self-pay | Admitting: Family Medicine

## 2015-07-18 DIAGNOSIS — M79672 Pain in left foot: Principal | ICD-10-CM

## 2015-07-18 DIAGNOSIS — M79671 Pain in right foot: Secondary | ICD-10-CM

## 2015-07-18 NOTE — Telephone Encounter (Signed)
Still having foot pain.  Wants referral to foot doctor.  Referral initiated

## 2015-07-21 NOTE — Telephone Encounter (Signed)
ok 

## 2015-07-30 ENCOUNTER — Ambulatory Visit (INDEPENDENT_AMBULATORY_CARE_PROVIDER_SITE_OTHER): Payer: Medicaid Other | Admitting: Podiatry

## 2015-07-30 ENCOUNTER — Encounter: Payer: Self-pay | Admitting: Podiatry

## 2015-07-30 VITALS — BP 130/98 | HR 76 | Ht 62.0 in | Wt 230.0 lb

## 2015-07-30 DIAGNOSIS — M79606 Pain in leg, unspecified: Secondary | ICD-10-CM | POA: Diagnosis not present

## 2015-07-30 DIAGNOSIS — R600 Localized edema: Secondary | ICD-10-CM

## 2015-07-30 DIAGNOSIS — M21969 Unspecified acquired deformity of unspecified lower leg: Secondary | ICD-10-CM | POA: Diagnosis not present

## 2015-07-30 NOTE — Progress Notes (Signed)
Subjective: 59 year old female presents complaining of pain in both feet, side of ankle, back of heel x about a year. Swelling on foot and ankle x 1 year. Takes fluid pills and helps.Too much pain to stand or walk. Pain is excruciating.   Systems reviews are positive for Lips cracking all the time. Goes to pain clinic for foot, back, and headaches.   Objective: Dermatologic: Thick painful nails on both great toes. Vascular: Pedal pulses are not palpable due to pedal edema.  Positive foot and ankle edema bilateral.  Positive of forefoot varus bilateral. Radiographic examination reveal rectus foot in AP view, and elevated first ray bilateral in lateral view. Posterior calcaneal spur is minimum without gross deformity.  Assessment: Bilateral lower limb edema. Metatarsal deformity bilateral. STJ hyperpronation with ankle pain bilateral.  Plan: Reviewed clinical findings and available treatment options. Patient is to try to control swelling first before go on with other treatment on foot condition. Advised to try compression socks during the day for the next two weeks and then return.

## 2015-07-30 NOTE — Patient Instructions (Signed)
Seen for painful feet. Noted of abnormal swelling on both foot and ankles. Noted of deformed metatarsal bones. Please purchase a pair of Compression socks and wear during the day and take off at night for the next two weeks. Return in 2 weeks.

## 2015-08-13 ENCOUNTER — Ambulatory Visit (INDEPENDENT_AMBULATORY_CARE_PROVIDER_SITE_OTHER): Payer: Medicaid Other | Admitting: Podiatry

## 2015-08-13 ENCOUNTER — Encounter: Payer: Self-pay | Admitting: Podiatry

## 2015-08-13 VITALS — BP 131/87 | HR 84

## 2015-08-13 DIAGNOSIS — M216X9 Other acquired deformities of unspecified foot: Secondary | ICD-10-CM | POA: Diagnosis not present

## 2015-08-13 DIAGNOSIS — M7662 Achilles tendinitis, left leg: Secondary | ICD-10-CM | POA: Diagnosis not present

## 2015-08-13 DIAGNOSIS — M79606 Pain in leg, unspecified: Secondary | ICD-10-CM

## 2015-08-13 DIAGNOSIS — M7661 Achilles tendinitis, right leg: Secondary | ICD-10-CM | POA: Diagnosis not present

## 2015-08-13 DIAGNOSIS — M21969 Unspecified acquired deformity of unspecified lower leg: Secondary | ICD-10-CM

## 2015-08-13 NOTE — Progress Notes (Signed)
Subjective: 59 year old female presents stating that swelling is a lot better since wearing compression stockinets. Her foot still hurts right heel>left heel.  Pain is at medial, posterior ankle and bottom of heel when walk. Pain is severe and unbearable at times. Both feet hurt the same.   Goes to pain clinic for foot, back, and headaches.   Objective: Dermatologic: Thick painful nails on both great toes. Vascular: Pedal pulses are not palpable due to pedal edema.  Positive foot and ankle edema bilateral.  Positive of forefoot varus bilateral. Last x-ray examination revealed cavus type foot with elevated first ray bilateral in lateral view.  There is significant posterior calcaneal spur bilateral. Inferior calcaneal spur is minimum on both feet.  Assessment: Metatarsal deformity, elevated first ray bilateral. STJ hyperpronation with ankle pain bilateral. Achilles tendonitis bilateral R>L.  Plan: Reviewed clinical findings and available treatment options. Right posterior heel injected with mixture of 4 mg Dexamethasone, 4 mg Triamcinolone, and 1 cc of 0.5% Marcaine plain. Patient tolerated well without difficulty.  Right lower limb wrapped with ACE and followed with CAM walker. Return in 3 weeks.

## 2015-08-13 NOTE — Patient Instructions (Signed)
Bilateral foot pain. Right posterior heel injected with cortisone. Right lower limb placed in CAM walker. Return in 3 weeks.

## 2015-08-20 ENCOUNTER — Other Ambulatory Visit: Payer: Self-pay | Admitting: Physician Assistant

## 2015-08-21 NOTE — Telephone Encounter (Signed)
Medication refilled per protocol. 

## 2015-09-03 ENCOUNTER — Ambulatory Visit (INDEPENDENT_AMBULATORY_CARE_PROVIDER_SITE_OTHER): Payer: Medicaid Other | Admitting: Podiatry

## 2015-09-03 ENCOUNTER — Ambulatory Visit: Payer: Medicaid Other | Admitting: Podiatry

## 2015-09-03 ENCOUNTER — Encounter: Payer: Self-pay | Admitting: Podiatry

## 2015-09-03 VITALS — BP 157/96 | HR 86

## 2015-09-03 DIAGNOSIS — M7662 Achilles tendinitis, left leg: Secondary | ICD-10-CM | POA: Diagnosis not present

## 2015-09-03 DIAGNOSIS — M79604 Pain in right leg: Secondary | ICD-10-CM | POA: Diagnosis not present

## 2015-09-03 DIAGNOSIS — M7661 Achilles tendinitis, right leg: Secondary | ICD-10-CM | POA: Diagnosis not present

## 2015-09-03 NOTE — Progress Notes (Signed)
Subjective: 59 year old female presents wearing CAM walker, stating that right heel still hurts but much better after the last injection. Really feels the pain in the morning when getting out of bed. Pain is still on the back of heel R>L. The rest of the foot hurts but the heel hurts the most.   Objective: Dermatologic: Thick painful nails on both great toes. Vascular: Pedal pulses are not palpable due to pedal edema.  No visible edema noted.  Positive foot and ankle edema bilateral.  Positive of forefoot varus bilateral.  Assessment: Reduced lower limb edema. Metatarsal deformity bilateral, elevated first ray. STJ hyperpronation with posterior ankle pain bilateral. Improved Achilles tendonitis right with CAM walker.   Plan: Reviewed clinical findings and available treatment options. Patient is to try Night Splint on right ankle.  Continue with CAM walker during the day for 2 more weeks. Continue with compression socks during the day.

## 2015-09-03 NOTE — Patient Instructions (Signed)
Improved heel pain right back of heel with injection and CAM walker. Pain in the morning may improve with Night Splint.  Continue with CAM walker as needed. Return in 2 weeks.

## 2015-09-17 ENCOUNTER — Ambulatory Visit (INDEPENDENT_AMBULATORY_CARE_PROVIDER_SITE_OTHER): Payer: Medicaid Other | Admitting: Podiatry

## 2015-09-17 ENCOUNTER — Encounter: Payer: Self-pay | Admitting: Podiatry

## 2015-09-17 VITALS — BP 141/88 | HR 80

## 2015-09-17 DIAGNOSIS — M7662 Achilles tendinitis, left leg: Secondary | ICD-10-CM | POA: Diagnosis not present

## 2015-09-17 DIAGNOSIS — M7661 Achilles tendinitis, right leg: Secondary | ICD-10-CM | POA: Diagnosis not present

## 2015-09-17 NOTE — Progress Notes (Signed)
Subjective: 59 year old female presents wearing sandals. Stated that she uses CAM walker during the day all day, which is helping. Getting pain again on the back of right heel, not yet totally unbearable R>L.  Stating that right heel still hurts but much better after the last injection. Taking Oxy contin 15 mg from health clinic for feet and back pain. Been getting for the past 14 months.  Really feels the pain in the morning when getting out of bed. Pain is still on the back of heel R>L. The rest of the foot hurts but the heel hurts the most.   Objective: Dermatologic: No open lesions.  Vascular: Pedal pulses are not palpable due to pedal edema.  No visible edema noted.  Positive foot and ankle edema bilateral.  Positive of forefoot varus bilateral. Positive of tight Achilles tendon right.   Assessment: Ankle equinus right. Recurring Achilles tendonitis right>left.  Metatarsal deformity bilateral, elevated first ray. STJ hyperpronation with posterior ankle pain bilateral.  Plan: Reviewed clinical findings and available treatment options. Reviewed possible benefit from Achilles tendon lengthening.  Patient wishes to pursue on Achilles tendon lengthening.  Reviewed pre op consent form for Per Cutaneous Achilles Tendon lengthening right. Explained post op care with cast and possible minimum improvement or requiring further surgical intervention.  Continue with CAM walker.

## 2015-10-01 ENCOUNTER — Other Ambulatory Visit: Payer: Self-pay | Admitting: Podiatry

## 2015-10-01 MED ORDER — HYDROCODONE-IBUPROFEN 7.5-200 MG PO TABS: 1.0000 | ORAL_TABLET | Freq: Three times a day (TID) | ORAL | Status: DC | PRN

## 2015-10-01 MED ORDER — NABUMETONE 500 MG PO TABS: 500.0000 mg | ORAL_TABLET | Freq: Two times a day (BID) | ORAL | Status: DC

## 2015-10-03 DIAGNOSIS — M7660 Achilles tendinitis, unspecified leg: Secondary | ICD-10-CM | POA: Diagnosis not present

## 2015-10-03 HISTORY — PX: ACHILLES TENDON LENGTHENING: SHX6455

## 2015-10-09 ENCOUNTER — Encounter: Payer: Self-pay | Admitting: Podiatry

## 2015-10-09 ENCOUNTER — Ambulatory Visit (INDEPENDENT_AMBULATORY_CARE_PROVIDER_SITE_OTHER): Payer: Medicaid Other | Admitting: Podiatry

## 2015-10-09 ENCOUNTER — Encounter: Payer: Medicaid Other | Admitting: Podiatry

## 2015-10-09 VITALS — BP 121/67 | HR 93

## 2015-10-09 DIAGNOSIS — Z9889 Other specified postprocedural states: Secondary | ICD-10-CM | POA: Insufficient documentation

## 2015-10-09 NOTE — Patient Instructions (Signed)
Doing well from Tendon lengthening surgery right. Continue take easy on walking. Return in 3 weeks.

## 2015-10-09 NOTE — Progress Notes (Signed)
5 days TAL Post op right. Walking well without pain.  Stated that intense pain on back of her heel has been gone.  Able to walk without pain with casted foot. Continue light ambulation. Return in 3 weeks.

## 2015-10-13 ENCOUNTER — Encounter: Payer: Self-pay | Admitting: Physician Assistant

## 2015-10-13 ENCOUNTER — Ambulatory Visit (INDEPENDENT_AMBULATORY_CARE_PROVIDER_SITE_OTHER): Payer: Medicaid Other | Admitting: Physician Assistant

## 2015-10-13 ENCOUNTER — Telehealth: Payer: Self-pay | Admitting: Family Medicine

## 2015-10-13 VITALS — BP 122/60 | HR 80 | Temp 98.1°F | Resp 18 | Wt 230.0 lb

## 2015-10-13 DIAGNOSIS — G6289 Other specified polyneuropathies: Secondary | ICD-10-CM

## 2015-10-13 DIAGNOSIS — N760 Acute vaginitis: Secondary | ICD-10-CM

## 2015-10-13 DIAGNOSIS — N952 Postmenopausal atrophic vaginitis: Secondary | ICD-10-CM | POA: Diagnosis not present

## 2015-10-13 LAB — WET PREP FOR TRICH, YEAST, CLUE
Clue Cells Wet Prep HPF POC: NONE SEEN
TRICH WET PREP: NONE SEEN
Yeast Wet Prep HPF POC: NONE SEEN

## 2015-10-13 MED ORDER — ESTRADIOL 0.1 MG/GM VA CREA: 1.0000 | TOPICAL_CREAM | Freq: Every day | VAGINAL | Status: DC

## 2015-10-13 MED ORDER — PREGABALIN 100 MG PO CAPS: 100.0000 mg | ORAL_CAPSULE | Freq: Three times a day (TID) | ORAL | Status: DC

## 2015-10-13 MED ORDER — ESTROGENS, CONJUGATED 0.625 MG/GM VA CREA: 1.0000 | TOPICAL_CREAM | Freq: Every day | VAGINAL | Status: DC

## 2015-10-13 NOTE — Telephone Encounter (Signed)
PA requested for Estrace Cream.  Medicaid covers Premarin Cream not Estrace.  Please advise?

## 2015-10-13 NOTE — Telephone Encounter (Signed)
Can change to Premarin Cream.

## 2015-10-13 NOTE — Telephone Encounter (Signed)
RX to pharmacy 
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
## 2015-10-14 LAB — GC/CHLAMYDIA PROBE AMP
CT PROBE, AMP APTIMA: NEGATIVE
GC PROBE AMP APTIMA: NEGATIVE

## 2015-10-15 ENCOUNTER — Encounter: Payer: Self-pay | Admitting: *Deleted

## 2015-10-29 ENCOUNTER — Encounter: Payer: Self-pay | Admitting: Podiatry

## 2015-10-29 ENCOUNTER — Ambulatory Visit (INDEPENDENT_AMBULATORY_CARE_PROVIDER_SITE_OTHER): Payer: Medicaid Other | Admitting: Podiatry

## 2015-10-29 DIAGNOSIS — Z9889 Other specified postprocedural states: Secondary | ICD-10-CM

## 2015-10-29 NOTE — Progress Notes (Signed)
4 weeks following TAL right. Has done well with walking in cast. Cast removed. Wound healed well. Sutures removed. Ace wrap placed.  Escorted to car in wheel chair. Patient is to put on her CAM walker in car and get out. Keep the boot during the day for ambulation. May take off at night.

## 2015-10-29 NOTE — Patient Instructions (Signed)
Cast removed. Doing well. May walk in CAM walker.

## 2015-11-10 ENCOUNTER — Other Ambulatory Visit: Payer: Self-pay | Admitting: Physician Assistant

## 2015-11-10 ENCOUNTER — Other Ambulatory Visit: Payer: Self-pay | Admitting: Gastroenterology

## 2015-11-11 NOTE — Telephone Encounter (Signed)
Medication refilled per protocol. 

## 2015-11-19 ENCOUNTER — Encounter: Payer: Medicaid Other | Admitting: Podiatry

## 2015-11-24 ENCOUNTER — Ambulatory Visit (INDEPENDENT_AMBULATORY_CARE_PROVIDER_SITE_OTHER): Payer: Medicaid Other | Admitting: Podiatry

## 2015-11-24 ENCOUNTER — Encounter: Payer: Self-pay | Admitting: Podiatry

## 2015-11-24 DIAGNOSIS — Z9889 Other specified postprocedural states: Secondary | ICD-10-CM

## 2015-11-24 NOTE — Progress Notes (Signed)
7 weeks post op following TAL right. Still wearing CAM walker. Stated that she is not walking on her heel.

## 2015-11-24 NOTE — Patient Instructions (Signed)
Doing well. Ok to get back to regular shoes. Return in January for the next check up.

## 2015-11-25 ENCOUNTER — Encounter: Payer: Medicaid Other | Admitting: Podiatry

## 2015-12-18 ENCOUNTER — Encounter (HOSPITAL_COMMUNITY): Payer: Self-pay | Admitting: *Deleted

## 2015-12-30 ENCOUNTER — Encounter: Payer: Medicaid Other | Admitting: Podiatry

## 2015-12-31 ENCOUNTER — Ambulatory Visit (INDEPENDENT_AMBULATORY_CARE_PROVIDER_SITE_OTHER): Payer: Medicaid Other | Admitting: Podiatry

## 2015-12-31 ENCOUNTER — Encounter: Payer: Medicaid Other | Admitting: Podiatry

## 2015-12-31 ENCOUNTER — Encounter: Payer: Self-pay | Admitting: Podiatry

## 2015-12-31 DIAGNOSIS — Z9889 Other specified postprocedural states: Secondary | ICD-10-CM

## 2015-12-31 NOTE — Progress Notes (Signed)
S/P TAL 3 month right foot. Pain is minimum now. Only some tenderness in instep area after prolonged ambulation. Since the arch is very high, it is recommended OTC hard orthotics. Recommended well supported tie up tennis shoes. Return as needed.

## 2015-12-31 NOTE — Patient Instructions (Signed)
Did well following tendon lengthening surgery right. May benefit from hard shell OTC orthotics to accommodate high arch foot. Return as needed.

## 2016-01-01 NOTE — Anesthesia Preprocedure Evaluation (Addendum)
Anesthesia Evaluation  Patient identified by MRN, date of birth, ID band Patient awake    Reviewed: Allergy & Precautions, NPO status , Patient's Chart, lab work & pertinent test results  History of Anesthesia Complications Negative for: history of anesthetic complications  Airway Mallampati: II  TM Distance: >3 FB Neck ROM: Full    Dental  (+) Edentulous Upper   Pulmonary COPD, former smoker,    breath sounds clear to auscultation       Cardiovascular hypertension,  Rhythm:Regular Rate:Normal     Neuro/Psych  Neuromuscular disease    GI/Hepatic   Endo/Other    Renal/GU      Musculoskeletal   Abdominal (+) + obese,   Peds  Hematology   Anesthesia Other Findings   Reproductive/Obstetrics                            Anesthesia Physical Anesthesia Plan  ASA: III  Anesthesia Plan: MAC   Post-op Pain Management:    Induction: Intravenous  Airway Management Planned: Natural Airway and Nasal Cannula  Additional Equipment:   Intra-op Plan:   Post-operative Plan:   Informed Consent: I have reviewed the patients History and Physical, chart, labs and discussed the procedure including the risks, benefits and alternatives for the proposed anesthesia with the patient or authorized representative who has indicated his/her understanding and acceptance.     Plan Discussed with: CRNA and Surgeon  Anesthesia Plan Comments:         Anesthesia Quick Evaluation

## 2016-01-02 ENCOUNTER — Ambulatory Visit (HOSPITAL_COMMUNITY)
Admission: RE | Admit: 2016-01-02 | Discharge: 2016-01-02 | Disposition: A | Discharge status: Home / Self Care | Payer: Medicaid Other | Source: Ambulatory Visit | Attending: Gastroenterology | Admitting: Gastroenterology

## 2016-01-02 ENCOUNTER — Ambulatory Visit (HOSPITAL_COMMUNITY): Payer: Medicaid Other | Admitting: Anesthesiology

## 2016-01-02 ENCOUNTER — Encounter (HOSPITAL_COMMUNITY)
Admission: RE | Disposition: A | Discharge status: Home / Self Care | Payer: Self-pay | Source: Ambulatory Visit | Attending: Gastroenterology

## 2016-01-02 ENCOUNTER — Encounter (HOSPITAL_COMMUNITY): Payer: Self-pay | Admitting: Anesthesiology

## 2016-01-02 DIAGNOSIS — D125 Benign neoplasm of sigmoid colon: Secondary | ICD-10-CM | POA: Diagnosis not present

## 2016-01-02 DIAGNOSIS — M549 Dorsalgia, unspecified: Secondary | ICD-10-CM | POA: Insufficient documentation

## 2016-01-02 DIAGNOSIS — J449 Chronic obstructive pulmonary disease, unspecified: Secondary | ICD-10-CM | POA: Insufficient documentation

## 2016-01-02 DIAGNOSIS — F418 Other specified anxiety disorders: Secondary | ICD-10-CM | POA: Diagnosis not present

## 2016-01-02 DIAGNOSIS — G629 Polyneuropathy, unspecified: Secondary | ICD-10-CM | POA: Insufficient documentation

## 2016-01-02 DIAGNOSIS — G8929 Other chronic pain: Secondary | ICD-10-CM | POA: Diagnosis not present

## 2016-01-02 DIAGNOSIS — D123 Benign neoplasm of transverse colon: Secondary | ICD-10-CM | POA: Insufficient documentation

## 2016-01-02 DIAGNOSIS — I10 Essential (primary) hypertension: Secondary | ICD-10-CM | POA: Insufficient documentation

## 2016-01-02 DIAGNOSIS — Z79899 Other long term (current) drug therapy: Secondary | ICD-10-CM | POA: Insufficient documentation

## 2016-01-02 DIAGNOSIS — Z86010 Personal history of colonic polyps: Secondary | ICD-10-CM | POA: Insufficient documentation

## 2016-01-02 DIAGNOSIS — Z1211 Encounter for screening for malignant neoplasm of colon: Secondary | ICD-10-CM | POA: Diagnosis present

## 2016-01-02 DIAGNOSIS — Z87891 Personal history of nicotine dependence: Secondary | ICD-10-CM | POA: Diagnosis not present

## 2016-01-02 HISTORY — PX: COLONOSCOPY WITH PROPOFOL: SHX5780

## 2016-01-02 SURGERY — COLONOSCOPY WITH PROPOFOL
Anesthesia: Monitor Anesthesia Care

## 2016-01-02 MED ORDER — LIDOCAINE HCL 1 % IJ SOLN
INTRAMUSCULAR | Status: AC
  Filled 2016-01-02: qty 20

## 2016-01-02 MED ORDER — PROPOFOL 10 MG/ML IV BOLUS
INTRAVENOUS | Status: AC
Start: 2016-01-02 — End: 2016-01-02
  Filled 2016-01-02: qty 40

## 2016-01-02 MED ORDER — SODIUM CHLORIDE 0.9 % IJ SOLN
INTRAMUSCULAR | Status: AC
  Filled 2016-01-02: qty 10

## 2016-01-02 MED ORDER — SODIUM CHLORIDE 0.9 % IV SOLN: INTRAVENOUS | Status: DC

## 2016-01-02 MED ORDER — EPHEDRINE SULFATE 50 MG/ML IJ SOLN
INTRAMUSCULAR | Status: AC
  Filled 2016-01-02: qty 1

## 2016-01-02 MED ORDER — LACTATED RINGERS IV SOLN
INTRAVENOUS | Status: DC
  Administered 2016-01-02: 1000 mL via INTRAVENOUS
  Administered 2016-01-02: 08:00:00 via INTRAVENOUS

## 2016-01-02 MED ORDER — PROPOFOL 500 MG/50ML IV EMUL
INTRAVENOUS | Status: DC | PRN
  Administered 2016-01-02: 30 mg via INTRAVENOUS

## 2016-01-02 MED ORDER — ONDANSETRON HCL 4 MG/2ML IJ SOLN
INTRAMUSCULAR | Status: AC
  Filled 2016-01-02: qty 4

## 2016-01-02 MED ORDER — PROPOFOL 500 MG/50ML IV EMUL
INTRAVENOUS | Status: DC | PRN
  Administered 2016-01-02: 150 ug/kg/min via INTRAVENOUS

## 2016-01-02 MED ORDER — PHENYLEPHRINE 40 MCG/ML (10ML) SYRINGE FOR IV PUSH (FOR BLOOD PRESSURE SUPPORT)
PREFILLED_SYRINGE | INTRAVENOUS | Status: AC
  Filled 2016-01-02: qty 10

## 2016-01-02 MED ORDER — PHENYLEPHRINE HCL 10 MG/ML IJ SOLN
INTRAMUSCULAR | Status: DC | PRN
  Administered 2016-01-02 (×2): 80 ug via INTRAVENOUS

## 2016-01-02 MED ORDER — ONDANSETRON HCL 4 MG/2ML IJ SOLN
INTRAMUSCULAR | Status: DC | PRN
  Administered 2016-01-02: 4 mg via INTRAVENOUS

## 2016-01-02 SURGICAL SUPPLY — 22 items
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
## 2016-01-02 NOTE — Anesthesia Postprocedure Evaluation (Signed)
Anesthesia Post Note  Patient: Makayla Lin  Procedure(s) Performed: Procedure(s) (LRB): COLONOSCOPY WITH PROPOFOL (N/A)  Patient location during evaluation: Endoscopy Anesthesia Type: MAC Level of consciousness: awake and alert Pain management: pain level controlled Vital Signs Assessment: post-procedure vital signs reviewed and stable Respiratory status: spontaneous breathing, nonlabored ventilation, respiratory function stable and patient connected to nasal cannula oxygen Cardiovascular status: stable and blood pressure returned to baseline Anesthetic complications: no    Last Vitals:  Filed Vitals:   01/02/16 1000 01/02/16 1010  BP: 94/49 104/68  Pulse: 88 88  Temp:    Resp: 16 17    Last Pain:  Filed Vitals:   01/02/16 1010  PainSc: 5                  Mabeline Varas,JAMES TERRILL

## 2016-01-02 NOTE — Op Note (Signed)
[facility] [place], [address]   COLONOSCOPY PROCEDURE REPORT  PATIENT: Makayla Lin, Makayla Lin  MR#: [id number] BIRTHDATE: [birth year] , 59  yrs. old GENDER: female ENDOSCOPIST: Carol Ada, MD REFERRED BY: PROCEDURE DATE:  01/31/2016 PROCEDURE:   Colonoscopy with snare polypectomy ASA CLASS:   Class III INDICATIONS:Personal history of polyps MEDICATIONS: Monitored anesthesia care  DESCRIPTION OF PROCEDURE:   After the risks and benefits and of the procedure were explained, informed consent was obtained.  revealed no abnormalities of the rectum.    The Pentax Ped Colon [id number] endoscope was introduced through the anus and advanced to the cecum, which was identified by both the appendix and ileocecal valve .  The quality of the prep was good. .  The instrument was then slowly withdrawn as the colon was fully examined. Estimated blood loss is zero unless otherwise noted in this procedure report.    FINDINGS: Two transverse and one sigmoid colon polyp were removed with a cold snare.  No evidence of any masses, inflammation, ulcerations, erosions, or vascular abnormalities.            The scope was then withdrawn from the patient and the procedure completed.  WITHDRAWAL TIME: 15 minutes  COMPLICATIONS: There were no immediate complications. ENDOSCOPIC IMPRESSION: 1) Polyps.  RECOMMENDATIONS: 1) Follow up biopsies. 2) Repeat the colonoscopy in 3-5 years.  REPEAT EXAM:  cc:  _______________________________ eSignedCarol Ada, MD 01-31-2016 10:02 AM   CPT CODES: ICD CODES:  The ICD and CPT codes recommended by this software are interpretations from the data that the clinical staff has captured with the software.  The verification of the translation of this report to the ICD and CPT codes and modifiers is the sole responsibility of the health care institution and practicing physician where this report was generated.  [employer] will not be held responsible for the validity of the ICD and CPT codes included on this report.  AMA assumes no liability for data contained or not contained herein. CPT is a Designer, television/film set of the [employer].

## 2016-01-02 NOTE — Discharge Instructions (Signed)

## 2016-01-02 NOTE — Transfer of Care (Signed)
Immediate Anesthesia Transfer of Care Note  Patient: Makayla Lin  Procedure(s) Performed: Procedure(s): COLONOSCOPY WITH PROPOFOL (N/A)  Patient Location: PACU  Anesthesia Type:MAC  Level of Consciousness:  sedated, patient cooperative and responds to stimulation  Airway & Oxygen Therapy:Patient Spontanous Breathing and Patient connected to face mask oxgen  Post-op Assessment:  Report given to PACU RN and Post -op Vital signs reviewed and stable  Post vital signs:  Reviewed and stable  Last Vitals:  Filed Vitals:   01/02/16 0753  BP: 125/54  Pulse: 85  Temp: 37.4 C  Resp: 16    Complications: No apparent anesthesia complications

## 2016-01-05 ENCOUNTER — Encounter (HOSPITAL_COMMUNITY): Payer: Self-pay | Admitting: Gastroenterology

## 2016-01-22 ENCOUNTER — Other Ambulatory Visit: Payer: Self-pay | Admitting: Physician Assistant

## 2016-01-23 ENCOUNTER — Encounter: Payer: Self-pay | Admitting: Family Medicine

## 2016-01-23 NOTE — Telephone Encounter (Signed)
Medication refill for one time only.  Patient needs to be seen.  Letter sent for patient to call and schedule 

## 2016-02-02 ENCOUNTER — Encounter: Payer: Self-pay | Admitting: Physician Assistant

## 2016-02-02 ENCOUNTER — Ambulatory Visit (INDEPENDENT_AMBULATORY_CARE_PROVIDER_SITE_OTHER): Payer: Medicaid Other | Admitting: Physician Assistant

## 2016-02-02 VITALS — BP 118/82 | HR 80 | Temp 98.2°F | Resp 18 | Wt 222.0 lb

## 2016-02-02 DIAGNOSIS — R600 Localized edema: Secondary | ICD-10-CM

## 2016-02-02 DIAGNOSIS — F411 Generalized anxiety disorder: Secondary | ICD-10-CM | POA: Diagnosis not present

## 2016-02-02 DIAGNOSIS — G8929 Other chronic pain: Secondary | ICD-10-CM | POA: Diagnosis not present

## 2016-02-02 DIAGNOSIS — Z5181 Encounter for therapeutic drug level monitoring: Secondary | ICD-10-CM

## 2016-02-02 DIAGNOSIS — I10 Essential (primary) hypertension: Secondary | ICD-10-CM

## 2016-02-02 DIAGNOSIS — M549 Dorsalgia, unspecified: Secondary | ICD-10-CM | POA: Diagnosis not present

## 2016-02-02 DIAGNOSIS — G6289 Other specified polyneuropathies: Secondary | ICD-10-CM

## 2016-02-02 DIAGNOSIS — F32A Depression, unspecified: Secondary | ICD-10-CM

## 2016-02-02 DIAGNOSIS — F329 Major depressive disorder, single episode, unspecified: Secondary | ICD-10-CM | POA: Diagnosis not present

## 2016-02-02 DIAGNOSIS — N952 Postmenopausal atrophic vaginitis: Secondary | ICD-10-CM | POA: Diagnosis not present

## 2016-02-02 LAB — COMPLETE METABOLIC PANEL WITH GFR
ALT: 28 U/L (ref 6–29)
AST: 21 U/L (ref 10–35)
Albumin: 3.8 g/dL (ref 3.6–5.1)
Alkaline Phosphatase: 82 U/L (ref 33–130)
BUN: 17 mg/dL (ref 7–25)
CHLORIDE: 98 mmol/L (ref 98–110)
CO2: 32 mmol/L — AB (ref 20–31)
Calcium: 9.2 mg/dL (ref 8.6–10.4)
Creat: 1.1 mg/dL — ABNORMAL HIGH (ref 0.50–1.05)
GFR, EST AFRICAN AMERICAN: 64 mL/min (ref >=60)
GFR, EST NON AFRICAN AMERICAN: 55 mL/min — AB (ref >=60)
Glucose, Bld: 125 mg/dL — ABNORMAL HIGH (ref 70–99)
Potassium: 3.8 mmol/L (ref 3.5–5.3)
Sodium: 139 mmol/L (ref 135–146)
Total Bilirubin: 0.3 mg/dL (ref 0.2–1.2)
Total Protein: 7.2 g/dL (ref 6.1–8.1)

## 2016-02-02 NOTE — Progress Notes (Signed)
Patient ID: Makayla Lin MRN: [id number], DOB: 1956-05-13, 60 y.o. Date of Encounter: 02/02/2016, 11:06 AM    Chief Complaint:  Chief Complaint  Patient presents with  . routine check up    not fasting     HPI: 60 y.o. year old white female here for the above.   Peripheral neuropathy: She had an office visit here with Dr. Dennard Schaumann 04/09/2014. At that visit he did labs to include CMET, TSH, hemoglobin A1c, vitamin B12. Also started her on gabapentin at that time. Labs were normal.  She had follow-up office visit with me 09/11/2014. At that [place], she reported the gabapentin was not helping. She said that she used her friend's Lyrica and that it worked great--- and she was requesting Lyrica prescription. She was changed to Lyrica.  THE FOLLOWING IS COPIED FROM MY OV NOTE--12/2/12015:   Today she says the Lyrica has helped so much.  However, is wondering if can increase the dose. Still having discomfort in bottoms of feet.   Says when she went to Pain Clinic, she reported the pain in her feet and reported the Lyrica but they ignored it. Said "it seemed like since it wasn't about my headache or back pain, they just acted like I didn't even say anything." At that OV, gradually increased Lyrica dose. Told her to gradually increase dose of Lyrica. Start with increasing the night time dose to 100mg , continue 50mg  dose at other times of day. Gradually increase dose at other times of day.  F/U if any adverse effects.   - pregabalin (LYRICA) 100 MG capsule; Take 1 capsule (100 mg total) by mouth 3 (three) times daily.  Dispense: 90 capsule; Refill: 3  03/31/2015:  Today she is requesting a refill on her Klonopin. I discussed with her that she needs to be getting all psych medicines from psych. She asked if she can please have a refill just to hold her over until she can follow-up with them. Says that her son is 13 years old and has diabetes but he does not treat it at all and he has severe  neuropathy and had stepped on something and didn't even know it and she is afraid he is going to lose his foot. A lot of stress going on. I told her that her blood pressure is high today and asked to visit been high at other doctor's checkups. She says yes it had. She is also needing refills on Lyrica. Says that pain management is not prescribing that medicine that I am.  04/16/15: She is taking losartan 100 mg daily. No adverse effects.   07/03/2015:  She states that she has been having swelling in her feet recently. Says she is taken HCTZ 12.5 daily.  She continues to take losartan daily with no adverse effects. She is still taking the Lyrica as directed and this is helping a lot and she is wanting to continue this. No other specific complaints or concerns today other than the swelling. However she does repeatedly ask me to refill her Klonopin.  02/02/2016: She is taking the losartan 100 mg with no adverse effects. She states that the current dose of Lyrica is working well. Causing no adverse effects. She has no complaints or concerns today.   Home Meds:   Outpatient Prescriptions Prior to Visit  Medication Sig Dispense Refill  . conjugated estrogens (PREMARIN) vaginal cream Place 1 Applicatorful vaginally daily. (Patient taking differently: Place 1 Applicatorful vaginally daily as needed (prior to intercourse). )  42.5 g 12  . docusate sodium (COLACE) 100 MG capsule Take 100 mg by mouth daily.    Marland Kitchen FLUoxetine (PROZAC) 20 MG capsule Take 60 mg by mouth daily.     . hydrochlorothiazide (HYDRODIURIL) 25 MG tablet TAKE 1 TABLET (25 MG TOTAL) BY MOUTH DAILY. 30 tablet 0  . losartan (COZAAR) 100 MG tablet TAKE 1 TABLET (100 MG TOTAL) BY MOUTH DAILY. 90 tablet 1  . oxyCODONE (ROXICODONE) 15 MG immediate release tablet Take 15 mg by mouth 5 (five) times daily.    . pregabalin (LYRICA) 100 MG capsule Take 1 capsule (100 mg total) by mouth 3 (three) times daily. 90 capsule 5  . traZODone (DESYREL)  100 MG tablet Take 200 mg by mouth at bedtime.    . gabapentin (NEURONTIN) 300 MG capsule TAKE 1 CAPSULE (300 MG TOTAL) BY MOUTH THREE TIMES DAILY. 90 capsule 0   No facility-administered medications prior to visit.    Allergies: No Known Allergies    Review of Systems: See HPI for pertinent ROS. All other ROS negative.    Physical Exam: Blood pressure 118/82, pulse 80, temperature 98.2 F (36.8 C), temperature source Oral, resp. rate 18, weight 222 lb (100.699 kg)., Body mass index is 40.59 kg/(m^2). General:  Overweight WF. Appears in no acute distress. Neck: Supple. No thyromegaly. No lymphadenopathy. Lungs: Clear bilaterally to auscultation without wheezes, rales, or rhonchi. Breathing is unlabored. Heart: Regular rhythm. No murmurs, rubs, or gallops. Msk:  Strength and tone normal for age. Extremities/Skin: She has pitting edema of feet up to ankle level bilaterally. 2+ edema at ankle level.  Neuro: Alert and oriented X 3. Moves all extremities spontaneously. Gait is normal. CNII-XII grossly in tact. 2+ patella reflexes bilaterally. Feet: Inspection normal. 2 + DP and Pt pulses bilaterally. Sensation intact.   Psych:  Responds to questions appropriately with a normal affect.     ASSESSMENT AND PLAN:  60 y.o. year old female with    Bilateral edema of lower extremity This is currently well controlled.   Essential hypertension Blood Pressure is now at goal. Continue current medications. Continue losartan 100 mg daily. BMET ---Normal 04/16/2015, repeat now.    Generalized anxiety disorder At visit 07/03/2015--I gave her no prescription for Klonopin and told her that I will not refill this medication and will not prescribe any psychiatry medicines. NO FURTHER FUTURE REFILLS--SHE IS TO F/U WITH PSYCH AND GET ALL [facility]CH MEDS FROM [facility]   Depression Per psychiatry  Chronic back pain Per pain clinic  Peripheral neuropathy Will cont Lyrica. Pain Clinic aware we are  prescribing this.  - pregabalin (LYRICA) 100 MG capsule; Take 1 capsule (100 mg total) by mouth 3 (three) times daily.  Dispense: 90 capsule; Refill: 3  Routine office visit 6 months or sooner if needed. Check labs to monitor medications at this time. No medication changes right now-- will follow-up lab results.   Signed, [address] [place], [place], [facility] 02/02/2016 11:06 AM

## 2016-02-05 ENCOUNTER — Encounter: Payer: Self-pay | Admitting: Family Medicine

## 2016-02-25 ENCOUNTER — Other Ambulatory Visit: Payer: Self-pay | Admitting: Physician Assistant

## 2016-04-05 ENCOUNTER — Ambulatory Visit (INDEPENDENT_AMBULATORY_CARE_PROVIDER_SITE_OTHER): Payer: Medicaid Other | Admitting: Physician Assistant

## 2016-04-05 ENCOUNTER — Encounter: Payer: Self-pay | Admitting: Physician Assistant

## 2016-04-05 VITALS — BP 120/70 | HR 82 | Temp 97.3°F | Resp 18 | Wt 218.0 lb

## 2016-04-05 DIAGNOSIS — N390 Urinary tract infection, site not specified: Secondary | ICD-10-CM | POA: Diagnosis not present

## 2016-04-05 DIAGNOSIS — R0602 Shortness of breath: Secondary | ICD-10-CM

## 2016-04-05 DIAGNOSIS — R3915 Urgency of urination: Secondary | ICD-10-CM

## 2016-04-05 LAB — URINALYSIS, ROUTINE W REFLEX MICROSCOPIC
Bilirubin Urine: NEGATIVE
GLUCOSE, UA: NEGATIVE
Ketones, ur: NEGATIVE
NITRITE: POSITIVE — AB
Specific Gravity, Urine: 1.02 (ref 1.001–1.035)
pH: 6 (ref 5.0–8.0)

## 2016-04-05 LAB — URINALYSIS, MICROSCOPIC ONLY
Casts: NONE SEEN [LPF]
Crystals: NONE SEEN [HPF]
Yeast: NONE SEEN [HPF]

## 2016-04-05 MED ORDER — CIPROFLOXACIN HCL 500 MG PO TABS: 500.0000 mg | ORAL_TABLET | Freq: Two times a day (BID) | ORAL | Status: DC
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
## 2016-04-08 LAB — URINE CULTURE

## 2016-04-20 ENCOUNTER — Encounter (HOSPITAL_COMMUNITY): Payer: Self-pay | Admitting: Family Medicine

## 2016-04-20 ENCOUNTER — Emergency Department (HOSPITAL_COMMUNITY)
Admission: EM | Admit: 2016-04-20 | Discharge: 2016-04-20 | Disposition: A | Discharge status: Home / Self Care | Payer: Medicaid Other | Source: Home / Self Care

## 2016-04-20 ENCOUNTER — Emergency Department (HOSPITAL_COMMUNITY)
Admission: EM | Admit: 2016-04-20 | Discharge: 2016-04-20 | Disposition: A | Discharge status: Home / Self Care | Payer: Medicaid Other | Attending: Emergency Medicine | Admitting: Emergency Medicine

## 2016-04-20 ENCOUNTER — Encounter (HOSPITAL_COMMUNITY): Payer: Self-pay | Admitting: *Deleted

## 2016-04-20 DIAGNOSIS — Y9389 Activity, other specified: Secondary | ICD-10-CM | POA: Insufficient documentation

## 2016-04-20 DIAGNOSIS — J449 Chronic obstructive pulmonary disease, unspecified: Secondary | ICD-10-CM | POA: Insufficient documentation

## 2016-04-20 DIAGNOSIS — W540XXA Bitten by dog, initial encounter: Secondary | ICD-10-CM

## 2016-04-20 DIAGNOSIS — Z792 Long term (current) use of antibiotics: Secondary | ICD-10-CM | POA: Diagnosis not present

## 2016-04-20 DIAGNOSIS — Z79899 Other long term (current) drug therapy: Secondary | ICD-10-CM | POA: Insufficient documentation

## 2016-04-20 DIAGNOSIS — Y998 Other external cause status: Secondary | ICD-10-CM | POA: Insufficient documentation

## 2016-04-20 DIAGNOSIS — F329 Major depressive disorder, single episode, unspecified: Secondary | ICD-10-CM | POA: Insufficient documentation

## 2016-04-20 DIAGNOSIS — F419 Anxiety disorder, unspecified: Secondary | ICD-10-CM | POA: Diagnosis not present

## 2016-04-20 DIAGNOSIS — S0181XA Laceration without foreign body of other part of head, initial encounter: Secondary | ICD-10-CM | POA: Insufficient documentation

## 2016-04-20 DIAGNOSIS — Z87891 Personal history of nicotine dependence: Secondary | ICD-10-CM | POA: Diagnosis not present

## 2016-04-20 DIAGNOSIS — S01551A Open bite of lip, initial encounter: Secondary | ICD-10-CM | POA: Diagnosis present

## 2016-04-20 DIAGNOSIS — S01501A Unspecified open wound of lip, initial encounter: Secondary | ICD-10-CM | POA: Insufficient documentation

## 2016-04-20 DIAGNOSIS — I10 Essential (primary) hypertension: Secondary | ICD-10-CM

## 2016-04-20 DIAGNOSIS — Y9289 Other specified places as the place of occurrence of the external cause: Secondary | ICD-10-CM | POA: Insufficient documentation

## 2016-04-20 DIAGNOSIS — S01552A Open bite of oral cavity, initial encounter: Secondary | ICD-10-CM

## 2016-04-20 MED ORDER — AMOXICILLIN-POT CLAVULANATE 875-125 MG PO TABS: 1.0000 | ORAL_TABLET | Freq: Two times a day (BID) | ORAL | Status: DC

## 2016-04-20 MED ORDER — BACITRACIN ZINC 500 UNIT/GM EX OINT: 1.0000 "application " | TOPICAL_OINTMENT | Freq: Three times a day (TID) | CUTANEOUS | Status: DC

## 2016-04-20 MED ORDER — OXYCODONE-ACETAMINOPHEN 5-325 MG PO TABS: 1.0000 | ORAL_TABLET | ORAL | Status: DC | PRN

## 2016-04-20 NOTE — ED Notes (Addendum)
Pt reports her dog bit her in the lip this morning.  She went to kiss the top of her head and bit her.  Puncture marks noted on her L cheek.  Pt states she cannot have any narcotics, she is a pt at the pain clinic.

## 2016-04-20 NOTE — Discharge Instructions (Signed)

## 2016-04-20 NOTE — ED Notes (Signed)
Pt here for dig bite to face. sts it was her dog and the animal is up to date on shots. Pt small avulsion to right upper lip. small laceration to left face. bleeding controlled.

## 2016-04-20 NOTE — ED Notes (Signed)
Pt states she has no patience and wants to leave. States she needed help "yesterday." RN encouraged pt to stay, but decided to leave.
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
## 2016-04-28 ENCOUNTER — Other Ambulatory Visit: Payer: Self-pay | Admitting: Family Medicine

## 2016-04-28 DIAGNOSIS — G6289 Other specified polyneuropathies: Secondary | ICD-10-CM

## 2016-04-28 NOTE — Telephone Encounter (Signed)
Approved. #[id number]. 

## 2016-04-28 NOTE — Telephone Encounter (Signed)
LRF 10/13/15 #90 + 5  LROV 02/02/16  Ok refill?

## 2016-04-29 MED ORDER — PREGABALIN 100 MG PO CAPS: 100.0000 mg | ORAL_CAPSULE | Freq: Three times a day (TID) | ORAL | Status: DC

## 2016-04-29 NOTE — Telephone Encounter (Signed)
rx called in

## 2016-05-03 DIAGNOSIS — W540XXA Bitten by dog, initial encounter: Secondary | ICD-10-CM | POA: Insufficient documentation

## 2016-05-28 ENCOUNTER — Telehealth: Payer: Self-pay | Admitting: Family Medicine

## 2016-05-28 NOTE — Telephone Encounter (Signed)
rec'd PA request for Losartan 100 mg.   Submitted thru [facility] # [id number].  Awaiting response.

## 2016-05-31 ENCOUNTER — Encounter (HOSPITAL_COMMUNITY): Payer: Self-pay | Admitting: Emergency Medicine

## 2016-05-31 ENCOUNTER — Emergency Department (HOSPITAL_COMMUNITY)
Admission: EM | Admit: 2016-05-31 | Discharge: 2016-06-01 | Disposition: A | Discharge status: Home / Self Care | Payer: Medicaid Other | Attending: Emergency Medicine | Admitting: Emergency Medicine

## 2016-05-31 ENCOUNTER — Emergency Department (HOSPITAL_COMMUNITY): Payer: Medicaid Other

## 2016-05-31 DIAGNOSIS — Z79899 Other long term (current) drug therapy: Secondary | ICD-10-CM | POA: Diagnosis not present

## 2016-05-31 DIAGNOSIS — Y939 Activity, unspecified: Secondary | ICD-10-CM | POA: Insufficient documentation

## 2016-05-31 DIAGNOSIS — Z87891 Personal history of nicotine dependence: Secondary | ICD-10-CM | POA: Diagnosis not present

## 2016-05-31 DIAGNOSIS — J449 Chronic obstructive pulmonary disease, unspecified: Secondary | ICD-10-CM | POA: Diagnosis not present

## 2016-05-31 DIAGNOSIS — R0781 Pleurodynia: Secondary | ICD-10-CM | POA: Insufficient documentation

## 2016-05-31 DIAGNOSIS — Y929 Unspecified place or not applicable: Secondary | ICD-10-CM | POA: Diagnosis not present

## 2016-05-31 DIAGNOSIS — W010XXA Fall on same level from slipping, tripping and stumbling without subsequent striking against object, initial encounter: Secondary | ICD-10-CM | POA: Diagnosis not present

## 2016-05-31 DIAGNOSIS — W19XXXA Unspecified fall, initial encounter: Secondary | ICD-10-CM

## 2016-05-31 DIAGNOSIS — R0789 Other chest pain: Secondary | ICD-10-CM | POA: Diagnosis present

## 2016-05-31 DIAGNOSIS — I10 Essential (primary) hypertension: Secondary | ICD-10-CM | POA: Diagnosis not present

## 2016-05-31 DIAGNOSIS — Y999 Unspecified external cause status: Secondary | ICD-10-CM | POA: Diagnosis not present

## 2016-05-31 MED ORDER — OXYCODONE HCL 5 MG PO TABS
15.0000 mg | ORAL_TABLET | Freq: Once | ORAL | Status: AC
  Administered 2016-06-01: 15 mg via ORAL
  Filled 2016-05-31: qty 3

## 2016-05-31 MED ORDER — IBUPROFEN 800 MG PO TABS
800.0000 mg | ORAL_TABLET | Freq: Once | ORAL | Status: AC
  Administered 2016-06-01: 800 mg via ORAL
  Filled 2016-05-31: qty 1

## 2016-05-31 NOTE — ED Notes (Addendum)
Pt states that she fell today because she turned her ankle in her shoes. Pt now has lower rib pain bilaterally. States she fell face first and did not catch herself. She is in a pain clinic and is unable to receive pain meds other than what she gets from them. O2 87-89% RA. Pt has hx of COPD. Alert and oriented.
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
## 2016-06-01 NOTE — Discharge Instructions (Signed)
As discussed, today's evaluation has been largely reassuring. In addition to your typical medication please use ibuprofen, 400 mg 3 times daily for the next 3 days.  Return here for concerning changes in your condition.

## 2016-06-02 ENCOUNTER — Encounter: Payer: Self-pay | Admitting: Family Medicine

## 2016-06-02 ENCOUNTER — Ambulatory Visit (INDEPENDENT_AMBULATORY_CARE_PROVIDER_SITE_OTHER): Payer: Medicaid Other | Admitting: Family Medicine

## 2016-06-02 VITALS — BP 134/70 | HR 80 | Temp 97.9°F | Resp 16 | Ht 62.0 in | Wt 215.0 lb

## 2016-06-02 DIAGNOSIS — R350 Frequency of micturition: Secondary | ICD-10-CM | POA: Diagnosis not present

## 2016-06-02 DIAGNOSIS — R7302 Impaired glucose tolerance (oral): Secondary | ICD-10-CM | POA: Diagnosis not present

## 2016-06-02 DIAGNOSIS — S20219D Contusion of unspecified front wall of thorax, subsequent encounter: Secondary | ICD-10-CM | POA: Diagnosis not present

## 2016-06-02 LAB — COMPREHENSIVE METABOLIC PANEL
ALBUMIN: 4.2 g/dL (ref 3.6–5.1)
ALT: 31 U/L — ABNORMAL HIGH (ref 6–29)
AST: 23 U/L (ref 10–35)
Alkaline Phosphatase: 104 U/L (ref 33–130)
BILIRUBIN TOTAL: 0.2 mg/dL (ref 0.2–1.2)
BUN: 12 mg/dL (ref 7–25)
CALCIUM: 9.5 mg/dL (ref 8.6–10.4)
CHLORIDE: 100 mmol/L (ref 98–110)
CO2: 26 mmol/L (ref 20–31)
CREATININE: 0.97 mg/dL (ref 0.50–1.05)
Glucose, Bld: 107 mg/dL — ABNORMAL HIGH (ref 70–99)
Potassium: 4.8 mmol/L (ref 3.5–5.3)
Sodium: 137 mmol/L (ref 135–146)
TOTAL PROTEIN: 7.5 g/dL (ref 6.1–8.1)

## 2016-06-02 LAB — CBC WITH DIFFERENTIAL/PLATELET
BASOS ABS: 0 {cells}/uL (ref 0–200)
BASOS PCT: 0 %
Eosinophils Absolute: 236 cells/uL (ref 15–500)
Eosinophils Relative: 2 %
HEMATOCRIT: 42.2 % (ref 35.0–45.0)
HEMOGLOBIN: 13.5 g/dL (ref 12.0–15.0)
LYMPHS ABS: 2124 {cells}/uL (ref 850–3900)
Lymphocytes Relative: 18 %
MCH: 27 pg (ref 27.0–33.0)
MCHC: 32 g/dL (ref 32.0–36.0)
MCV: 84.4 fL (ref 80.0–100.0)
MONO ABS: 590 {cells}/uL (ref 200–950)
MPV: 11.3 fL (ref 7.5–12.5)
Monocytes Relative: 5 %
NEUTROS ABS: 8850 {cells}/uL — AB (ref 1500–7800)
Neutrophils Relative %: 75 %
PLATELETS: 309 10*3/uL (ref 140–400)
RBC: 5 MIL/uL (ref 3.80–5.10)
RDW: 15.5 % — ABNORMAL HIGH (ref 11.0–15.0)
WBC: 11.8 10*3/uL — ABNORMAL HIGH (ref 3.8–10.8)

## 2016-06-02 LAB — URINALYSIS, ROUTINE W REFLEX MICROSCOPIC
Bilirubin Urine: NEGATIVE
GLUCOSE, UA: NEGATIVE
Hgb urine dipstick: NEGATIVE
Ketones, ur: NEGATIVE
NITRITE: NEGATIVE
PH: 5.5 (ref 5.0–8.0)
Protein, ur: NEGATIVE
SPECIFIC GRAVITY, URINE: 1.02 (ref 1.001–1.035)

## 2016-06-02 LAB — URINALYSIS, MICROSCOPIC ONLY
CASTS: NONE SEEN [LPF]
CRYSTALS: NONE SEEN [HPF]
RBC / HPF: NONE SEEN RBC/HPF (ref <=2)
Yeast: NONE SEEN [HPF]

## 2016-06-02 LAB — HEMOGLOBIN A1C
Hgb A1c MFr Bld: 6.1 % — ABNORMAL HIGH (ref <5.7)
Mean Plasma Glucose: 128 mg/dL

## 2016-06-02 NOTE — Patient Instructions (Addendum)
We will call with lab results Try ICE/heat and alternate  COntinue your pain medication F/U pending results Rib Contusion A rib contusion is a deep bruise on your rib area. Contusions are the result of a blunt trauma that causes bleeding and injury to the tissues under the skin. A rib contusion may involve bruising of the ribs and of the skin and muscles in the area. The skin overlying the contusion may turn blue, purple, or yellow. Minor injuries will give you a painless contusion, but more severe contusions may stay painful and swollen for a few weeks. CAUSES  A contusion is usually caused by a blow, trauma, or direct force to an area of the body. This often occurs while playing contact sports. SYMPTOMS  Swelling and redness of the injured area.  Discoloration of the injured area.  Tenderness and soreness of the injured area.  Pain with or without movement. DIAGNOSIS  The diagnosis can be made by taking a medical history and performing a physical exam. An X-ray, CT scan, or MRI may be needed to determine if there were any associated injuries, such as broken bones (fractures) or internal injuries. TREATMENT  Often, the best treatment for a rib contusion is rest. Icing or applying cold compresses to the injured area may help reduce swelling and inflammation. Deep breathing exercises may be recommended to reduce the risk of partial lung collapse and pneumonia. Over-the-counter or prescription medicines may also be recommended for pain control. HOME CARE INSTRUCTIONS   Apply ice to the injured area:  Put ice in a plastic bag.  Place a towel between your skin and the bag.  Leave the ice on for 20 minutes, 2-3 times per day.  Take medicines only as directed by your health care provider.  Rest the injured area. Avoid strenuous activity and any activities or movements that cause pain. Be careful during activities and avoid bumping the injured area.  Perform deep-breathing exercises as  directed by your health care provider.  Do not lift anything that is heavier than 5 lb (2.3 kg) until your health care provider approves.  Do not use any tobacco products, including cigarettes, chewing tobacco, or electronic cigarettes. If you need help quitting, ask your health care provider. SEEK MEDICAL CARE IF:   You have increased bruising or swelling.  You have pain that is not controlled with treatment.  You have a fever. SEEK IMMEDIATE MEDICAL CARE IF:   You have difficulty breathing or shortness of breath.  You develop a continual cough, or you cough up thick or bloody sputum.  You feel sick to your stomach (nauseous), you throw up (vomit), or you have abdominal pain.   This information is not intended to replace advice given to you by your health care provider. Make sure you discuss any questions you have with your health care provider.   Document Released: 09/07/2001 Document Revised: 01/03/2015 Document Reviewed: 09/24/2014 Elsevier Interactive Patient Education [employer].

## 2016-06-02 NOTE — Progress Notes (Signed)
Patient ID: Makayla Lin, female   DOB: 12-05-1956, 60 y.o.   MRN: [id number]    Subjective:    Patient ID: Makayla Lin, female    DOB: 06-May-1956, 60 y.o.   MRN: [id number]  Patient presents for ER F/U Patient here for ER follow-up. She was at a friend's house when she tripped going down the stairs outside of the home she fell face forward smacking her chest and to the ground. She was seen in the ER x-ray did not show any fractures of the ribs or sternum she has bruising she is not convinced that this is just bruising that can cause as much pain as she is in. She is in a pain clinic and is on oxycodone every 4 hours as needed. She has discomfort when she takes a deep breath riding the front where she has bruising on her breast and over the ribs.  She also complains of urinary frequency stases he dribbles that time feels she always has to go. She was seen for this about a month ago she was given antibiotics at that time for urinary tract infection. She denies any burning with urination. On review of her records she also has glucose intolerance and no A1c in greater one year. She denies any  polydipsia    Review Of Systems:  GEN- denies fatigue, fever, weight loss,weakness, recent illness HEENT- denies eye drainage, change in vision, nasal discharge, CVS- denies chest pain, palpitations RESP- denies SOB, cough, wheeze ABD- denies N/V, change in stools, abd pain GU- denies dysuria, hematuria, dribbling, incontinence MSK- + joint pain, muscle aches, injury Neuro- denies headache, dizziness, syncope, seizure activity       Objective:    BP 134/70 mmHg  Pulse 80  Temp(Src) 97.9 F (36.6 C) (Oral)  Resp 16  Ht 5\' 2"  (1.575 m)  Wt 215 lb (97.523 kg)  BMI 39.31 kg/m2 GEN- NAD, alert and oriented x3 CVS- RRR, no murmur RESP-CTAB GEN- NAD, alert and oriented, Neck- supple, no thyromegaly Breast- normal symmetry, no nipple inversion,no nipple drainage, no nodules or lumps felt  Bruising across both nippls and lower half of breast, probable small hematoma on left breast beneath bruising 7 o' clock position Chest wall- TTP lower frontal ribs, sternum NT  ABD-NABS,soft,NT,ND, no CVA tenderness  EXT- No edema Pulses- Radial, DP- 2+        Assessment & Plan:      Problem List Items Addressed This Visit    None    Visit Diagnoses    Rib contusion, unspecified laterality, subsequent encounter    -  Primary    Xray neg, contusion with bruising, ICE to area, continue pain meds per pain clinic, advised healing 2-3weeks    Urinary frequency        Check UA/Cultuire,also check A1C with glucose intolerance and obesity    Relevant Orders    Urinalysis, Routine w reflex microscopic (not at [facility]) (Completed)    Urine culture    Glucose intolerance (impaired glucose tolerance)        Relevant Orders    CBC with Differential/Platelet    Hemoglobin A1c    Comprehensive metabolic panel       Note: This dictation was prepared with Dragon dictation along with smaller phrase technology. Any transcriptional errors that result from this process are unintentional.

## 2016-06-03 ENCOUNTER — Telehealth: Payer: Self-pay | Admitting: *Deleted

## 2016-06-03 LAB — URINE CULTURE
Colony Count: NO GROWTH
Organism ID, Bacteria: NO GROWTH

## 2016-06-03 NOTE — Telephone Encounter (Signed)
Received request from pharmacy for PA on Losartan.   PA submitted. Confirmation # [id number] W.    Dx: HTN (I10).

## 2016-06-08 ENCOUNTER — Other Ambulatory Visit: Payer: Self-pay | Admitting: Family Medicine

## 2016-06-08 DIAGNOSIS — N3281 Overactive bladder: Secondary | ICD-10-CM | POA: Insufficient documentation

## 2016-06-08 MED ORDER — MIRABEGRON ER 25 MG PO TB24: 25.0000 mg | ORAL_TABLET | Freq: Every day | ORAL | Status: DC

## 2016-06-08 NOTE — Telephone Encounter (Signed)
Received PA determination.   PA approved 06/03/2016- 06/03/2017.  Case ID: [id number].   Pharmacy made aware.

## 2016-06-11 NOTE — Telephone Encounter (Signed)
PA has been completed

## 2016-06-14 ENCOUNTER — Telehealth: Payer: Self-pay | Admitting: *Deleted

## 2016-06-14 MED ORDER — OXYBUTYNIN CHLORIDE ER 5 MG PO TB24: 5.0000 mg | ORAL_TABLET | Freq: Every day | ORAL | Status: DC

## 2016-06-14 NOTE — Telephone Encounter (Signed)
Received PA request for Myrbetriq.   MD made aware and new orders obtained to change therapy to Oxybutynin 5mg  PO QD.   Order sent to pharmacy.

## 2016-08-02 ENCOUNTER — Ambulatory Visit (INDEPENDENT_AMBULATORY_CARE_PROVIDER_SITE_OTHER): Payer: Medicaid Other | Admitting: Family Medicine

## 2016-08-02 ENCOUNTER — Encounter: Payer: Self-pay | Admitting: Family Medicine

## 2016-08-02 VITALS — BP 126/64 | HR 80 | Temp 99.0°F | Resp 14 | Ht 62.0 in | Wt 213.0 lb

## 2016-08-02 DIAGNOSIS — N390 Urinary tract infection, site not specified: Secondary | ICD-10-CM

## 2016-08-02 MED ORDER — CIPROFLOXACIN HCL 500 MG PO TABS: 500.0000 mg | ORAL_TABLET | Freq: Two times a day (BID) | ORAL | 0 refills | Status: DC

## 2016-08-02 NOTE — Progress Notes (Signed)
    Subjective:    Patient ID: Makayla Lin, female    DOB: 10/04/1956, 60 y.o.   MRN: [id number]  Patient presents for Dysuria (urinary frequency and urgency)  Pt here with dsuria for the past wee. Denies abdominal pain, fever, no odor. Feels like she cant empty her bladder, gets burning and tingling and only dribbles small amount, feels like previous infection.. Denies hematuria    She has underlying OAB as well on Ditropan  Review Of Systems:  GEN- denies fatigue, fever, weight loss,weakness, recent illness HEENT- denies eye drainage, change in vision, nasal discharge, CVS- denies chest pain, palpitations RESP- denies SOB, cough, wheeze ABD- denies N/V, change in stools, abd pain GU-+ dysuria, denies  hematuria, dribbling, incontinence MSK- denies joint pain, muscle aches, injury Neuro- denies headache, dizziness, syncope, seizure activity       Objective:    BP 126/64 (BP Location: Left Arm, Patient Position: Sitting, Cuff Size: Large)   Pulse 80   Temp 99 F (37.2 C) (Oral)   Resp 14   Ht 5\' 2"  (1.575 m)   Wt 213 lb (96.6 kg)   BMI 38.96 kg/m  GEN- NAD, alert and oriented x3 CVS- RRR, no murmur RESP-CTAB ABD-NABS,soft,NT,ND, no CVA tenderness  Pulses- Radial, 2+   UA shows trace Leukocytes, 0-5 WBC ,small bacteria, Neg blood      Assessment & Plan:      Problem List Items Addressed This Visit    None    Visit Diagnoses    UTI (lower urinary tract infection)    -  Primary   Start Cipro as symptomatic , send for culture.   Relevant Orders   Urinalysis, Routine w reflex microscopic (not at [facility])   Urine culture      Note: This dictation was prepared with Dragon dictation along with smaller phrase technology. Any transcriptional errors that result from this process are unintentional.

## 2016-08-02 NOTE — Patient Instructions (Addendum)
Take antibiotics as prescribed Increase water intake We will call with culture result  Carmex/Balmex or vaseline  F/U as needed

## 2016-08-03 LAB — URINALYSIS, MICROSCOPIC ONLY
Casts: NONE SEEN [LPF]
Crystals: NONE SEEN [HPF]
YEAST: NONE SEEN [HPF]

## 2016-08-03 LAB — URINALYSIS, ROUTINE W REFLEX MICROSCOPIC
Bilirubin Urine: NEGATIVE
GLUCOSE, UA: NEGATIVE
HGB URINE DIPSTICK: NEGATIVE
Ketones, ur: NEGATIVE
NITRITE: NEGATIVE
PROTEIN: NEGATIVE
Specific Gravity, Urine: 1.015 (ref 1.001–1.035)
pH: 6 (ref 5.0–8.0)

## 2016-08-03 LAB — URINE CULTURE: Organism ID, Bacteria: 10000

## 2016-08-16 ENCOUNTER — Other Ambulatory Visit: Payer: Self-pay | Admitting: Physician Assistant

## 2016-08-16 NOTE — Telephone Encounter (Signed)
Medication refilled per protocol. 

## 2016-10-13 ENCOUNTER — Other Ambulatory Visit (HOSPITAL_BASED_OUTPATIENT_CLINIC_OR_DEPARTMENT_OTHER): Payer: Self-pay

## 2016-10-13 ENCOUNTER — Ambulatory Visit (HOSPITAL_BASED_OUTPATIENT_CLINIC_OR_DEPARTMENT_OTHER): Payer: Medicaid Other | Attending: Anesthesiology | Admitting: Internal Medicine

## 2016-10-13 DIAGNOSIS — G4710 Hypersomnia, unspecified: Secondary | ICD-10-CM

## 2016-10-13 DIAGNOSIS — G4733 Obstructive sleep apnea (adult) (pediatric): Secondary | ICD-10-CM | POA: Diagnosis not present

## 2016-10-13 DIAGNOSIS — G4700 Insomnia, unspecified: Secondary | ICD-10-CM | POA: Diagnosis present

## 2016-10-13 DIAGNOSIS — R0683 Snoring: Secondary | ICD-10-CM

## 2016-10-13 DIAGNOSIS — R5383 Other fatigue: Secondary | ICD-10-CM

## 2016-10-17 DIAGNOSIS — G4710 Hypersomnia, unspecified: Secondary | ICD-10-CM | POA: Diagnosis not present

## 2016-10-17 NOTE — Procedures (Signed)
Patient Name: Makayla Lin, Makayla Lin Date: 10/13/2016 Gender: Female D.O.B: 1956/11/05 Age (years): 69 Referring Provider: Ivy Lynn Dakwa Height (inches): 62 Interpreting Physician: Baird Lyons MD, ABSM Weight (lbs): 230 RPSGT: Gerhard Perches BMI: 42 MRN: [id number] Neck Size: 16.00 CLINICAL INFORMATION Sleep Study Type: Split Night CPAP Indication for sleep study: Excessive Daytime Sleepiness, Fatigue Epworth Sleepiness Score: 13  SLEEP STUDY TECHNIQUE As per the AASM Manual for the Scoring of Sleep and Associated Events v2.3 (April 2016) with a hypopnea requiring 4% desaturations. The channels recorded and monitored were frontal, central and occipital EEG, electrooculogram (EOG), submentalis EMG (chin), nasal and oral airflow, thoracic and abdominal wall motion, anterior tibialis EMG, snore microphone, electrocardiogram, and pulse oximetry. Continuous positive airway pressure (CPAP) was initiated when the patient met split night criteria and was titrated according to treat sleep-disordered breathing.  MEDICATIONS Medications self-administered by patient taken the night of the study : TRAZODONE  RESPIRATORY PARAMETERS Diagnostic Total AHI (/hr): 14.0 RDI (/hr): 17.9 OA Index (/hr): 10.4 CA Index (/hr): 0.7 REM AHI (/hr): 9.5 NREM AHI (/hr): 14.5 Supine AHI (/hr): 14.0 Non-supine AHI (/hr): N/A Min O2 Sat (%): 81.00 Mean O2 (%): 88.49 Time below 88% (min): 107.7   Titration Optimal Pressure (cm): 16 AHI at Optimal Pressure (/hr): 12.3 Min O2 at Optimal Pressure (%): 85.0 Supine % at Optimal (%): 100 Sleep % at Optimal (%): 99    SLEEP ARCHITECTURE The recording time for the entire night was 395.7 minutes. During a baseline period of 186.0 minutes, the patient slept for 184.5 minutes in REM and nonREM, yielding a sleep efficiency of 99.2%. Sleep onset after lights out was 0.3 minutes with a REM latency of 135.0 minutes. The patient spent 0.54% of the night in stage N1 sleep,  66.12% in stage N2 sleep, 23.04% in stage N3 and 10.30% in REM. During the titration period of 204.8 minutes, the patient slept for 202.9 minutes in REM and nonREM, yielding a sleep efficiency of 99.1%. Sleep onset after CPAP initiation was 0.0 minutes with a REM latency of 79.9 minutes. The patient spent 0.42% of the night in stage N1 sleep, 41.90% in stage N2 sleep, 0.00% in stage N3 and 57.67% in REM.  CARDIAC DATA The 2 lead EKG demonstrated sinus rhythm. The mean heart rate was 80.34 beats per minute. Other EKG findings include: PVCs.  LEG MOVEMENT DATA The total Periodic Limb Movements of Sleep (PLMS) were 0. The PLMS index was 0.00 .  IMPRESSIONS - Mild obstructive sleep apnea occurred during the diagnostic portion of the study (AHI = 14.0 /hour). An optimal PAP pressure was selected for this patient ( 16 cm of water) - No significant central sleep apnea occurred during the diagnostic portion of the study (CAI = 0.7/hour). - Moderate oxygen desaturation was noted during the diagnostic portion of the study (Min O2 =81.00%). - The patient snored with Soft snoring volume during the diagnostic portion of the study. - EKG findings include PVCs. - Clinically significant periodic limb movements did not occur during sleep.  DIAGNOSIS - Obstructive Sleep Apnea (327.23 [G47.33 ICD-10])  RECOMMENDATIONS - Trial of CPAP therapy on 16 cm H2O with a Small size Resmed Full Face Mask AirFit F20 mask and heated humidification. - Avoid alcohol, sedatives and other CNS depressants that may worsen sleep apnea and disrupt normal sleep architecture. - Sleep hygiene should be reviewed to assess factors that may improve sleep quality. - Weight management and regular exercise should be initiated or continued.  [Electronically signed] 10/17/2016 01:18  PM  Baird Lyons MD, [place], American Board of Sleep Medicine   NPI: [id number]  [place], American Board of Sleep  Medicine  ELECTRONICALLY SIGNED ON:  10/17/2016, 1:16 PM [place] PH: [phone]   FX: [phone] [place]

## 2016-11-05 ENCOUNTER — Other Ambulatory Visit: Payer: Self-pay | Admitting: Physician Assistant

## 2016-11-05 ENCOUNTER — Telehealth: Payer: Self-pay | Admitting: Physician Assistant

## 2016-11-05 DIAGNOSIS — G6289 Other specified polyneuropathies: Secondary | ICD-10-CM

## 2016-11-05 NOTE — Telephone Encounter (Signed)
Last Ov 2-6 Last refill 04-29-2016 Okay to refill?

## 2016-11-05 NOTE — Telephone Encounter (Signed)
Rx req  submitted for approval

## 2016-11-05 NOTE — Telephone Encounter (Signed)
Cb# [phone]  Patient requesting a refill on her lyrica 100 [facility]. They are going to the pharmacy in a little bit to pick up other medications would like this called in as soon as possible.

## 2016-11-08 NOTE — Telephone Encounter (Signed)
Patient called to check on status of prescription for lyrica. Patient is completely out do we have any samples or do we know if they have approved the PA? IF we have samples can patient pick up some.  CB# [phone]

## 2016-11-08 NOTE — Telephone Encounter (Signed)
Approved. # 90 + 2. 

## 2016-11-08 NOTE — Telephone Encounter (Signed)
Rx filled and called to pharmacy  No samples were avail

## 2016-11-17 ENCOUNTER — Other Ambulatory Visit: Payer: Self-pay | Admitting: Physician Assistant

## 2016-12-09 ENCOUNTER — Telehealth: Payer: Self-pay | Admitting: *Deleted

## 2016-12-09 NOTE — Telephone Encounter (Signed)
Received request from pharmacy for PA on Lyrica.   Patient has failed treatment with Cymbalta and Gabapentin.   PA submitted via [employer].   Dx: neuropathic pain.  Confirmation # [id number] W.

## 2016-12-10 ENCOUNTER — Telehealth: Payer: Self-pay

## 2016-12-10 NOTE — Telephone Encounter (Signed)
PA denied. Please advise

## 2016-12-10 NOTE — Telephone Encounter (Deleted)
Prior Authorization for lyrica was denied. Will  appeal it on 12-18

## 2016-12-10 NOTE — Telephone Encounter (Signed)
Spoke with pt regarding denial of Lyrica and explained to pt will try and appeal on 12-18

## 2016-12-10 NOTE — Telephone Encounter (Signed)
Call placed to [facility] to determine reason for denial.   Advised that multiple PA requests had been received and duplicates have been denied.   Original request [id number]) approved x1 year.   Pharmacy made aware and patient made aware.

## 2016-12-13 NOTE — Telephone Encounter (Signed)
She sees pain management. I recommend she follow-up with them regarding alternative treatments. Tell her to inform them that her insurance will not cover Lyrica.

## 2016-12-30 ENCOUNTER — Telehealth: Payer: Self-pay | Admitting: Family Medicine

## 2016-12-30 NOTE — Telephone Encounter (Signed)
Rec'd letter form Medicaid about PA for Lyrica.  Current request denied because they state an approval was done for this medication on 12/09/2016.  This was a duplicate request.

## 2017-01-28 ENCOUNTER — Other Ambulatory Visit: Payer: Self-pay | Admitting: Physician Assistant

## 2017-01-28 DIAGNOSIS — G6289 Other specified polyneuropathies: Secondary | ICD-10-CM

## 2017-01-28 NOTE — Telephone Encounter (Signed)
Last OV 8-7 Last refill  11-13 Okay to refill?

## 2017-01-31 NOTE — Telephone Encounter (Signed)
Lyrica approved for #90+5 refills

## 2017-01-31 NOTE — Telephone Encounter (Signed)
Rx  Phoned in

## 2017-03-03 ENCOUNTER — Ambulatory Visit (INDEPENDENT_AMBULATORY_CARE_PROVIDER_SITE_OTHER): Payer: Medicaid Other | Admitting: Physician Assistant

## 2017-03-03 ENCOUNTER — Encounter: Payer: Self-pay | Admitting: Physician Assistant

## 2017-03-03 VITALS — BP 110/78 | HR 90 | Temp 98.0°F | Resp 18 | Wt 217.0 lb

## 2017-03-03 DIAGNOSIS — R002 Palpitations: Secondary | ICD-10-CM

## 2017-03-03 DIAGNOSIS — I10 Essential (primary) hypertension: Secondary | ICD-10-CM | POA: Diagnosis not present

## 2017-03-03 MED ORDER — HYDROCHLOROTHIAZIDE 25 MG PO TABS: ORAL_TABLET | ORAL | 0 refills | Status: DC

## 2017-03-03 MED ORDER — LOSARTAN POTASSIUM 100 MG PO TABS: ORAL_TABLET | ORAL | 0 refills | Status: DC

## 2017-03-04 NOTE — Progress Notes (Signed)
Patient ID: Makayla Lin MRN: [id number], DOB: 03-Nov-1956, 61 y.o. Date of Encounter: 03/04/2017, 7:40 AM    Chief Complaint:  Chief Complaint  Patient presents with  . Hypertension    phq score 7     HPI: 61 y.o. year old female presents with above.   She has been checking her BP some over the past 2 weeks. Got reading of 189/90--that was the highest reading, but most readings have been running high so came in.  Has had no angina symptoms, no stroke symptoms. No chest pressure, heaviness, tightness. No weakness in any extremity or slurred speech.     Home Meds:   Outpatient Medications Prior to Visit  Medication Sig Dispense Refill  . conjugated estrogens (PREMARIN) vaginal cream Place 1 Applicatorful vaginally daily. (Patient taking differently: Place 1 Applicatorful vaginally daily as needed (prior to intercourse). ) 42.5 g 12  . LYRICA 100 MG capsule TAKE ONE CAPSULE   BY MOUTH   THREE TIMES A DAY 90 capsule 5  . oxybutynin (DITROPAN-XL) 5 MG 24 hr tablet Take 1 tablet (5 mg total) by mouth at bedtime. 30 tablet 3  . oxyCODONE (ROXICODONE) 15 MG immediate release tablet Take 15 mg by mouth every 4 (four) hours as needed for pain.     . traZODone (DESYREL) 100 MG tablet Take 200 mg by mouth at bedtime.    . hydrochlorothiazide (HYDRODIURIL) 25 MG tablet TAKE 1 TABLET (25 MG TOTAL) BY MOUTH DAILY. 30 tablet 0  . losartan (COZAAR) 100 MG tablet TAKE 1 TABLET (100 MG TOTAL) BY MOUTH DAILY. 90 tablet 0  . FLUoxetine (PROZAC) 20 MG capsule Take 60 mg by mouth daily.     . ciprofloxacin (CIPRO) 500 MG tablet Take 1 tablet (500 mg total) by mouth 2 (two) times daily. 10 tablet 0   No facility-administered medications prior to visit.     Allergies: No Known Allergies    Review of Systems: See HPI for pertinent ROS. All other ROS negative.    Physical Exam: Blood pressure 164/92, pulse 90, temperature 98 F (36.7 C), temperature source Oral, resp. rate 18, weight 217 lb  (98.4 kg), SpO2 94 %., Body mass index is 38.44 kg/m. General:  WF. Appears in no acute distress. Neck: Supple. No thyromegaly. No lymphadenopathy. Lungs: Clear bilaterally to auscultation without wheezes, rales, or rhonchi. Breathing is unlabored. Heart: Regular rhythm. No murmurs, rubs, or gallops. Msk:  Strength and tone normal for age. Extremities/Skin: Warm and dry.  Neuro: Alert and oriented X 3. Moves all extremities spontaneously. Gait is normal. CNII-XII grossly in tact. Psych:  Responds to questions appropriately with a normal affect.     ASSESSMENT AND PLAN:  61 y.o. year old female with  1. Essential hypertension Nurse/staff was having difficulty hearing BP and I rechecked it and had difficulty hearing it as well.  Heart sounds diminished secondary to large breasts and obesity.  EKG obtained to make sure she was not in A Fib or other arrhythmia. EKG shows NSR.  She is currently taking losartan 100mg  QD. In past was also on HCTZ ---was not refilled b/c she never came in for OV---last Rx said "needs OV" Will add back HCTZ.  Will have her return for f/u OV 2 weeks to recheck BP and BMET.  - hydrochlorothiazide (HYDRODIURIL) 25 MG tablet; TAKE 1 TABLET (25 MG TOTAL) BY MOUTH DAILY.  Dispense: 30 tablet; Refill: 0 - losartan (COZAAR) 100 MG tablet; TAKE 1 TABLET (100 MG  TOTAL) BY MOUTH DAILY.  Dispense: 90 tablet; Refill: 0  2. Palpitation - EKG 12-Lead   Signed, [address] [place], [place], [facility] 03/04/2017 7:40 AM

## 2017-03-21 ENCOUNTER — Encounter: Payer: Self-pay | Admitting: Physician Assistant

## 2017-03-21 ENCOUNTER — Ambulatory Visit (INDEPENDENT_AMBULATORY_CARE_PROVIDER_SITE_OTHER): Payer: Medicaid Other | Admitting: Physician Assistant

## 2017-03-21 VITALS — BP 102/80 | HR 84 | Temp 97.6°F | Resp 14 | Wt 222.2 lb

## 2017-03-21 DIAGNOSIS — G629 Polyneuropathy, unspecified: Secondary | ICD-10-CM

## 2017-03-21 DIAGNOSIS — I10 Essential (primary) hypertension: Secondary | ICD-10-CM | POA: Diagnosis not present

## 2017-03-21 LAB — BASIC METABOLIC PANEL WITH GFR
BUN: 23 mg/dL (ref 7–25)
CALCIUM: 9.3 mg/dL (ref 8.6–10.4)
CHLORIDE: 101 mmol/L (ref 98–110)
CO2: 29 mmol/L (ref 20–31)
Creat: 1.26 mg/dL — ABNORMAL HIGH (ref 0.50–1.05)
GFR, Est African American: 54 mL/min — ABNORMAL LOW (ref >=60)
GFR, Est Non African American: 47 mL/min — ABNORMAL LOW (ref >=60)
GLUCOSE: 130 mg/dL — AB (ref 70–99)
Potassium: 4.3 mmol/L (ref 3.5–5.3)
Sodium: 139 mmol/L (ref 135–146)

## 2017-03-21 MED ORDER — HYDROCHLOROTHIAZIDE 25 MG PO TABS: ORAL_TABLET | ORAL | 1 refills | Status: DC

## 2017-03-21 NOTE — Progress Notes (Signed)
Patient ID: Makayla Lin MRN: [id number], DOB: [birth year], 61 y.o. Date of Encounter: 03/21/2017, 12:50 PM    Chief Complaint:  Chief Complaint  Patient presents with  . Hypertension     HPI: 61 y.o. year old female presents with above.   03/03/2017: She has been checking her BP some over the past 2 weeks. Got reading of 189/90--that was the highest reading, but most readings have been running high so came in.  Has had no angina symptoms, no stroke symptoms. No chest pressure, heaviness, tightness. No weakness in any extremity or slurred speech.  At that visit I reviewed that in the past she had also been on HCTZ in addition to losartan. Also noted that the HCTZ was not refilled because she did not come in for follow-up office visit. At that OV restarted HCTZ and had her continue the losartan 100 mg daily. She was to return for follow-up visit to recheck BP and bmet 2 weeks.    03/21/2017: Today she states that she has been taking HCTZ 25 mg daily in addition to the losartan 100 mg daily. She has been checking blood pressure at home and has been getting good readings. She is having no adverse effects and no concerns or complaints today.  Home Meds:   Outpatient Medications Prior to Visit  Medication Sig Dispense Refill  . conjugated estrogens (PREMARIN) vaginal cream Place 1 Applicatorful vaginally daily. (Patient taking differently: Place 1 Applicatorful vaginally daily as needed (prior to intercourse). ) 42.5 g 12  . FLUoxetine (PROZAC) 20 MG capsule Take 60 mg by mouth daily.     Marland Kitchen losartan (COZAAR) 100 MG tablet TAKE 1 TABLET (100 MG TOTAL) BY MOUTH DAILY. 90 tablet 0  . LYRICA 100 MG capsule TAKE ONE CAPSULE   BY MOUTH   THREE TIMES A DAY 90 capsule 5  . oxybutynin (DITROPAN-XL) 5 MG 24 hr tablet Take 1 tablet (5 mg total) by mouth at bedtime. 30 tablet 3  . oxyCODONE (ROXICODONE) 15 MG immediate release tablet Take 15 mg by mouth every 4 (four) hours as needed for pain.       . traZODone (DESYREL) 100 MG tablet Take 200 mg by mouth at bedtime.    . hydrochlorothiazide (HYDRODIURIL) 25 MG tablet TAKE 1 TABLET (25 MG TOTAL) BY MOUTH DAILY. 30 tablet 0   No facility-administered medications prior to visit.     Allergies: No Known Allergies    Review of Systems: See HPI for pertinent ROS. All other ROS negative.    Physical Exam: Blood pressure 118/74, pulse 90, temperature 98 F (36.7 C), temperature source Oral, resp. rate 18, weight 217 lb (98.4 kg), SpO2 98 %., Body mass index is 39.36 kg/m. General:  WF. Appears in no acute distress. Neck: Supple. No thyromegaly. No lymphadenopathy.No carotid bruits. Lungs: Clear bilaterally to auscultation without wheezes, rales, or rhonchi. Breathing is unlabored. Heart: Regular rhythm. No murmurs, rubs, or gallops. Msk:  Strength and tone normal for age. Extremities/Skin: Warm and dry.  Neuro: Alert and oriented X 3. Moves all extremities spontaneously. Gait is normal. CNII-XII grossly in tact. Psych:  Responds to questions appropriately with a normal affect.     ASSESSMENT AND PLAN:  61 y.o. year old female with   1. Essential hypertension 03/21/2017: Blood Pressure is controlled. Will continue current dose of HCTZ and losartan. Check lab to monitor. - BASIC METABOLIC PANEL WITH GFR - hydrochlorothiazide (HYDRODIURIL) 25 MG tablet; TAKE 1 TABLET (25 MG TOTAL)  BY MOUTH DAILY.  Dispense: 90 tablet; Refill: 1     Signed, [address] [place], [place], [facility] 03/21/2017 12:50 PM

## 2017-03-23 ENCOUNTER — Other Ambulatory Visit: Payer: Self-pay | Admitting: Physician Assistant

## 2017-03-23 DIAGNOSIS — I10 Essential (primary) hypertension: Secondary | ICD-10-CM

## 2017-06-17 ENCOUNTER — Other Ambulatory Visit: Payer: Self-pay | Admitting: Physician Assistant

## 2017-06-17 DIAGNOSIS — I10 Essential (primary) hypertension: Secondary | ICD-10-CM

## 2017-06-17 NOTE — Telephone Encounter (Signed)
Refill appropriate 

## 2017-06-21 ENCOUNTER — Other Ambulatory Visit: Payer: Self-pay

## 2017-06-21 DIAGNOSIS — I10 Essential (primary) hypertension: Secondary | ICD-10-CM

## 2017-07-11 ENCOUNTER — Other Ambulatory Visit: Payer: Self-pay | Admitting: Physician Assistant

## 2017-07-11 DIAGNOSIS — G6289 Other specified polyneuropathies: Secondary | ICD-10-CM

## 2017-07-11 NOTE — Telephone Encounter (Signed)
ok 

## 2017-07-11 NOTE — Telephone Encounter (Signed)
Last OV 03/21/2017 Last refill 01/31/2007 Ok to refill?

## 2017-07-13 NOTE — Telephone Encounter (Signed)
Okay to refill? 

## 2017-07-14 NOTE — Telephone Encounter (Signed)
Medication called to pharmacy. 

## 2017-08-02 ENCOUNTER — Other Ambulatory Visit: Payer: Self-pay | Admitting: Physician Assistant

## 2017-08-02 DIAGNOSIS — Z1231 Encounter for screening mammogram for malignant neoplasm of breast: Secondary | ICD-10-CM

## 2017-08-12 ENCOUNTER — Ambulatory Visit: Payer: Medicaid Other

## 2017-08-31 ENCOUNTER — Ambulatory Visit
Admission: RE | Admit: 2017-08-31 | Discharge: 2017-08-31 | Disposition: A | Discharge status: Home / Self Care | Payer: Medicaid Other | Source: Ambulatory Visit | Attending: Physician Assistant | Admitting: Physician Assistant

## 2017-08-31 DIAGNOSIS — Z1231 Encounter for screening mammogram for malignant neoplasm of breast: Secondary | ICD-10-CM

## 2017-11-03 ENCOUNTER — Other Ambulatory Visit: Payer: Self-pay | Admitting: Physician Assistant

## 2017-11-03 DIAGNOSIS — I10 Essential (primary) hypertension: Secondary | ICD-10-CM

## 2017-11-04 NOTE — Telephone Encounter (Signed)
rx filled per protocol.Patient is due for an office visit.Letter mailed for patient to call and schedule an appointment

## 2017-12-10 ENCOUNTER — Other Ambulatory Visit: Payer: Self-pay | Admitting: Physician Assistant

## 2017-12-10 DIAGNOSIS — I10 Essential (primary) hypertension: Secondary | ICD-10-CM

## 2017-12-12 NOTE — Telephone Encounter (Signed)
Office visit is required before anymore refills

## 2017-12-14 ENCOUNTER — Other Ambulatory Visit: Payer: Self-pay

## 2017-12-14 ENCOUNTER — Encounter: Payer: Self-pay | Admitting: Physician Assistant

## 2017-12-14 ENCOUNTER — Ambulatory Visit: Payer: Medicaid Other | Admitting: Physician Assistant

## 2017-12-14 VITALS — BP 120/80 | HR 102 | Temp 97.6°F | Resp 16 | Wt 222.8 lb

## 2017-12-14 DIAGNOSIS — R239 Unspecified skin changes: Secondary | ICD-10-CM

## 2017-12-14 DIAGNOSIS — G6289 Other specified polyneuropathies: Secondary | ICD-10-CM | POA: Diagnosis not present

## 2017-12-14 DIAGNOSIS — L659 Nonscarring hair loss, unspecified: Secondary | ICD-10-CM | POA: Diagnosis not present

## 2017-12-14 DIAGNOSIS — R350 Frequency of micturition: Secondary | ICD-10-CM | POA: Diagnosis not present

## 2017-12-14 DIAGNOSIS — N3281 Overactive bladder: Secondary | ICD-10-CM | POA: Diagnosis not present

## 2017-12-14 DIAGNOSIS — I10 Essential (primary) hypertension: Secondary | ICD-10-CM | POA: Diagnosis not present

## 2017-12-14 DIAGNOSIS — G629 Polyneuropathy, unspecified: Secondary | ICD-10-CM

## 2017-12-14 DIAGNOSIS — N390 Urinary tract infection, site not specified: Secondary | ICD-10-CM | POA: Diagnosis not present

## 2017-12-14 LAB — URINALYSIS, ROUTINE W REFLEX MICROSCOPIC
Bilirubin Urine: NEGATIVE
GLUCOSE, UA: NEGATIVE
HGB URINE DIPSTICK: NEGATIVE
Ketones, ur: NEGATIVE
Nitrite: POSITIVE — AB
PH: 6 (ref 5.0–8.0)
Protein, ur: NEGATIVE
RBC / HPF: NONE SEEN /HPF (ref 0–2)
SPECIFIC GRAVITY, URINE: 1.025 (ref 1.001–1.03)
WBC, UA: >=60 /HPF — AB (ref 0–5)

## 2017-12-14 LAB — MICROSCOPIC MESSAGE

## 2017-12-14 MED ORDER — PREGABALIN 100 MG PO CAPS: 100.0000 mg | ORAL_CAPSULE | Freq: Three times a day (TID) | ORAL | 5 refills | Status: DC

## 2017-12-14 MED ORDER — CIPROFLOXACIN HCL 500 MG PO TABS: 500.0000 mg | ORAL_TABLET | Freq: Two times a day (BID) | ORAL | 0 refills | Status: DC

## 2017-12-14 MED ORDER — CIPROFLOXACIN HCL 500 MG PO TABS: 500.0000 mg | ORAL_TABLET | Freq: Two times a day (BID) | ORAL | 0 refills | Status: AC
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
## 2017-12-15 LAB — BASIC METABOLIC PANEL WITH GFR
BUN: 16 mg/dL (ref 7–25)
CALCIUM: 9.6 mg/dL (ref 8.6–10.4)
CHLORIDE: 104 mmol/L (ref 98–110)
CO2: 29 mmol/L (ref 20–32)
Creat: 0.95 mg/dL (ref 0.50–0.99)
GFR, EST NON AFRICAN AMERICAN: 65 mL/min/{1.73_m2} (ref >=60)
GFR, Est African American: 75 mL/min/{1.73_m2} (ref >=60)
Glucose, Bld: 80 mg/dL (ref 65–99)
POTASSIUM: 4.6 mmol/L (ref 3.5–5.3)
SODIUM: 140 mmol/L (ref 135–146)

## 2017-12-15 LAB — TSH: TSH: 1.98 m[IU]/L (ref 0.40–4.50)

## 2017-12-15 LAB — EXTRA LAV TOP TUBE

## 2017-12-19 ENCOUNTER — Telehealth: Payer: Self-pay

## 2017-12-19 DIAGNOSIS — G6289 Other specified polyneuropathies: Secondary | ICD-10-CM

## 2017-12-19 NOTE — Telephone Encounter (Signed)
Prior authorization started on lyrica 100 mg  

## 2017-12-28 NOTE — Telephone Encounter (Signed)
Lyrica has been approved  Effective dates 12/19/2017-06/17/2018. Pharmacy aware

## 2018-01-02 ENCOUNTER — Telehealth: Payer: Self-pay

## 2018-01-02 NOTE — Telephone Encounter (Signed)
Patient called requesting lab results.I returned patient call [id number]

## 2018-01-04 ENCOUNTER — Other Ambulatory Visit: Payer: Self-pay

## 2018-01-04 DIAGNOSIS — G6289 Other specified polyneuropathies: Secondary | ICD-10-CM

## 2018-01-04 NOTE — Telephone Encounter (Signed)
Last OV 12/14/2017 Last refill 12/192018 Ok to refill?

## 2018-01-04 NOTE — Telephone Encounter (Signed)
Approved for #90+5.

## 2018-01-05 MED ORDER — PREGABALIN 100 MG PO CAPS: 100.0000 mg | ORAL_CAPSULE | Freq: Three times a day (TID) | ORAL | 5 refills | Status: DC

## 2018-01-05 NOTE — Telephone Encounter (Signed)
rx called into pharmacy

## 2018-01-06 NOTE — Telephone Encounter (Signed)
Called placed to patient she is aware of lab results.

## 2018-02-08 ENCOUNTER — Other Ambulatory Visit: Payer: Self-pay | Admitting: Family Medicine

## 2018-02-08 ENCOUNTER — Other Ambulatory Visit: Payer: Self-pay | Admitting: Physician Assistant

## 2018-02-08 DIAGNOSIS — G6289 Other specified polyneuropathies: Secondary | ICD-10-CM

## 2018-02-08 DIAGNOSIS — I10 Essential (primary) hypertension: Secondary | ICD-10-CM

## 2018-02-08 NOTE — Telephone Encounter (Signed)
Ok to refill??  Last office visit 08/02/2017.  Last refill 01/05/2018.

## 2018-02-08 NOTE — Telephone Encounter (Signed)
LOV 12/14/2017.

## 2018-02-08 NOTE — Telephone Encounter (Signed)
Approved. Sent.

## 2018-02-16 ENCOUNTER — Other Ambulatory Visit: Payer: Self-pay

## 2018-02-16 DIAGNOSIS — G6289 Other specified polyneuropathies: Secondary | ICD-10-CM

## 2018-02-16 MED ORDER — PREGABALIN 100 MG PO CAPS: 100.0000 mg | ORAL_CAPSULE | Freq: Three times a day (TID) | ORAL | 5 refills | Status: DC

## 2018-02-16 NOTE — Telephone Encounter (Signed)
Ok to refill 

## 2018-02-17 NOTE — Telephone Encounter (Signed)
Patient is aware rx can be picked up

## 2018-03-15 ENCOUNTER — Encounter: Payer: Self-pay | Admitting: Physician Assistant

## 2018-03-15 ENCOUNTER — Ambulatory Visit (INDEPENDENT_AMBULATORY_CARE_PROVIDER_SITE_OTHER): Payer: Medicaid Other | Admitting: Physician Assistant

## 2018-03-15 VITALS — BP 142/90 | HR 83 | Temp 98.2°F | Resp 14 | Wt 212.6 lb

## 2018-03-15 DIAGNOSIS — G4719 Other hypersomnia: Secondary | ICD-10-CM | POA: Diagnosis not present

## 2018-03-15 DIAGNOSIS — R5383 Other fatigue: Secondary | ICD-10-CM

## 2018-03-15 DIAGNOSIS — M722 Plantar fascial fibromatosis: Secondary | ICD-10-CM

## 2018-03-15 LAB — CBC WITH DIFFERENTIAL/PLATELET
BASOS PCT: 0.5 %
Basophils Absolute: 56 cells/uL (ref 0–200)
EOS ABS: 168 {cells}/uL (ref 15–500)
Eosinophils Relative: 1.5 %
HCT: 45.8 % — ABNORMAL HIGH (ref 35.0–45.0)
Hemoglobin: 15.2 g/dL (ref 11.7–15.5)
Lymphs Abs: 2363 cells/uL (ref 850–3900)
MCH: 28 pg (ref 27.0–33.0)
MCHC: 33.2 g/dL (ref 32.0–36.0)
MCV: 84.3 fL (ref 80.0–100.0)
MONOS PCT: 5.6 %
MPV: 12.1 fL (ref 7.5–12.5)
NEUTROS ABS: 7986 {cells}/uL — AB (ref 1500–7800)
Neutrophils Relative %: 71.3 %
PLATELETS: 255 10*3/uL (ref 140–400)
RBC: 5.43 10*6/uL — AB (ref 3.80–5.10)
RDW: 13.5 % (ref 11.0–15.0)
TOTAL LYMPHOCYTE: 21.1 %
WBC: 11.2 10*3/uL — AB (ref 3.8–10.8)
WBCMIX: 627 {cells}/uL (ref 200–950)

## 2018-03-15 MED ORDER — NAPROXEN 500 MG PO TABS: 500.0000 mg | ORAL_TABLET | Freq: Two times a day (BID) | ORAL | 2 refills | Status: DC
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
## 2018-05-24 ENCOUNTER — Other Ambulatory Visit: Payer: Self-pay

## 2018-05-24 ENCOUNTER — Encounter: Payer: Self-pay | Admitting: Physician Assistant

## 2018-05-24 ENCOUNTER — Ambulatory Visit (INDEPENDENT_AMBULATORY_CARE_PROVIDER_SITE_OTHER): Payer: Medicaid Other | Admitting: Physician Assistant

## 2018-05-24 VITALS — BP 150/100 | HR 91 | Temp 97.6°F | Resp 14 | Ht 62.0 in | Wt 205.2 lb

## 2018-05-24 DIAGNOSIS — Z6830 Body mass index (BMI) 30.0-30.9, adult: Secondary | ICD-10-CM | POA: Diagnosis not present

## 2018-05-24 DIAGNOSIS — F17200 Nicotine dependence, unspecified, uncomplicated: Secondary | ICD-10-CM | POA: Diagnosis not present

## 2018-05-24 DIAGNOSIS — E6609 Other obesity due to excess calories: Secondary | ICD-10-CM | POA: Diagnosis not present
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
## 2018-07-20 ENCOUNTER — Telehealth: Payer: Self-pay

## 2018-07-20 DIAGNOSIS — G6289 Other specified polyneuropathies: Secondary | ICD-10-CM

## 2018-07-20 NOTE — Telephone Encounter (Signed)
Prior authorization started on lyrica 100 mg

## 2018-07-25 NOTE — Telephone Encounter (Signed)
Prior authorization approved   Last OV 12/14/2017 Last refill 02/16/2018 Ok to refill

## 2018-07-26 MED ORDER — PREGABALIN 100 MG PO CAPS
100.0000 mg | ORAL_CAPSULE | Freq: Three times a day (TID) | ORAL | 5 refills | Status: DC
Start: 2018-07-26 — End: 2018-09-20

## 2018-09-11 ENCOUNTER — Encounter: Payer: Self-pay | Admitting: Physician Assistant

## 2018-09-11 ENCOUNTER — Ambulatory Visit: Payer: Medicaid Other | Admitting: Physician Assistant

## 2018-09-11 VITALS — BP 136/82 | HR 93 | Temp 97.6°F | Resp 16 | Ht 62.0 in | Wt 202.0 lb

## 2018-09-11 DIAGNOSIS — K130 Diseases of lips: Secondary | ICD-10-CM

## 2018-09-11 MED ORDER — CLOTRIMAZOLE 1 % EX OINT: TOPICAL_OINTMENT | CUTANEOUS | 1 refills | Status: DC

## 2018-09-11 NOTE — Progress Notes (Signed)
    Patient ID: Makayla Lin MRN: [id number], DOB: 12/04/1956, 62 y.o. Date of Encounter: 09/11/2018, 3:04 PM    Chief Complaint:  Chief Complaint  Patient presents with  . sore on lip    symptoms for 4-5 months      HPI: 62 y.o. year old female presents with above.   She has rash and fissures in corners of mouth bilaterally. She reports that she has tried applying some bacitracin ointment with no improvement. Sates "knows that she isn't supposed to use peoples' medicines -- but has used boyfriends Valtrex-- that he has for cold sores--- thinking that it may be cold sores--- but this is not working either.  She does have dentures     Home Meds:   Outpatient Medications Prior to Visit  Medication Sig Dispense Refill  . conjugated estrogens (PREMARIN) vaginal cream Place 1 Applicatorful vaginally daily. (Patient taking differently: Place 1 Applicatorful vaginally daily as needed (prior to intercourse). ) 42.5 g 12  . FLUoxetine (PROZAC) 20 MG capsule Take 60 mg by mouth daily.     . hydrochlorothiazide (HYDRODIURIL) 25 MG tablet TAKE 1 TABLET (25 MG TOTAL) BY MOUTH DAILY. 30 tablet 0  . losartan (COZAAR) 100 MG tablet TAKE 1 TABLET (100 MG TOTAL) BY MOUTH DAILY. 90 tablet 0  . naproxen (NAPROSYN) 500 MG tablet Take 1 tablet (500 mg total) by mouth 2 (two) times daily with a meal. 60 tablet 2  . oxybutynin (DITROPAN-XL) 5 MG 24 hr tablet Take 1 tablet (5 mg total) by mouth at bedtime. 30 tablet 3  . oxyCODONE (ROXICODONE) 15 MG immediate release tablet Take 15 mg by mouth every 4 (four) hours as needed for pain. Patient taking 20 mg    . pregabalin (LYRICA) 100 MG capsule Take 1 capsule (100 mg total) by mouth 3 (three) times daily. 90 capsule 5  . traZODone (DESYREL) 100 MG tablet Take 200 mg by mouth at bedtime. Taking 300 mg     No facility-administered medications prior to visit.     Allergies: No Known Allergies    Review of Systems: See HPI for pertinent ROS. All  other ROS negative.    Physical Exam: Blood pressure 136/82, pulse 93, temperature 97.6 F (36.4 C), temperature source Oral, resp. rate 16, height 5\' 2"  (1.575 m), weight 91.6 kg, SpO2 95 %., Body mass index is 36.95 kg/m. General:  WF. Appears in no acute distress. HEENT: Fissures at corners of lips bilaterally.  Neck: Supple. No thyromegaly. No lymphadenopathy. Lungs: Clear bilaterally to auscultation without wheezes, rales, or rhonchi. Breathing is unlabored. Heart: Regular rhythm. No murmurs, rubs, or gallops. Msk:  Strength and tone normal for age. Extremities/Skin: Warm and dry.  Neuro: Alert and oriented X 3. Moves all extremities spontaneously. Gait is normal. CNII-XII grossly in tact. Psych:  Responds to questions appropriately with a normal affect.     ASSESSMENT AND PLAN:  62 y.o. year old female with   1. Angular cheilitis Has angular cheilitis most likely secondary to Candida/moisture.  May have to do with the fact that she wears dentures and changes with her mouth as she gets older.  To treat with clotrimazole ointment.  Follow-up if not controlled with this medication. - Clotrimazole 1 % OINT; Apply to affected areas twice a day.  Dispense: 15 g; Refill: 1   Signed, [address] [place], [place], [facility] 09/11/2018 3:04 PM

## 2018-09-20 ENCOUNTER — Other Ambulatory Visit: Payer: Self-pay

## 2018-09-20 DIAGNOSIS — G6289 Other specified polyneuropathies: Secondary | ICD-10-CM

## 2018-09-20 MED ORDER — PREGABALIN 100 MG PO CAPS: 100.0000 mg | ORAL_CAPSULE | Freq: Three times a day (TID) | ORAL | 5 refills | Status: DC

## 2018-09-20 NOTE — Telephone Encounter (Signed)
Last OV 05/24/2018 Last refill 07/26/2018 Ok to refill

## 2018-12-22 ENCOUNTER — Ambulatory Visit: Payer: Medicaid Other | Admitting: Family Medicine

## 2018-12-22 ENCOUNTER — Encounter: Payer: Self-pay | Admitting: Family Medicine

## 2018-12-22 VITALS — BP 128/66 | HR 86 | Temp 98.4°F | Resp 14 | Ht 62.0 in | Wt 207.0 lb

## 2018-12-22 DIAGNOSIS — G629 Polyneuropathy, unspecified: Secondary | ICD-10-CM

## 2018-12-22 DIAGNOSIS — N952 Postmenopausal atrophic vaginitis: Secondary | ICD-10-CM | POA: Diagnosis not present

## 2018-12-22 DIAGNOSIS — M545 Low back pain, unspecified: Secondary | ICD-10-CM

## 2018-12-22 DIAGNOSIS — G8929 Other chronic pain: Secondary | ICD-10-CM

## 2018-12-22 DIAGNOSIS — G6289 Other specified polyneuropathies: Secondary | ICD-10-CM

## 2018-12-22 DIAGNOSIS — Z1159 Encounter for screening for other viral diseases: Secondary | ICD-10-CM

## 2018-12-22 DIAGNOSIS — I10 Essential (primary) hypertension: Secondary | ICD-10-CM

## 2018-12-22 DIAGNOSIS — F411 Generalized anxiety disorder: Secondary | ICD-10-CM

## 2018-12-22 MED ORDER — PREGABALIN 100 MG PO CAPS: 100.0000 mg | ORAL_CAPSULE | Freq: Three times a day (TID) | ORAL | 2 refills | Status: DC

## 2018-12-22 MED ORDER — LOSARTAN POTASSIUM 100 MG PO TABS: ORAL_TABLET | ORAL | 2 refills | Status: DC

## 2018-12-22 NOTE — Assessment & Plan Note (Signed)
Blood pressure is well controlled.  We will check a cholesterol panel on her as well.

## 2018-12-22 NOTE — Assessment & Plan Note (Signed)
Continue Lyrica for her neuropathy.  With very unclear if this was isolated neuropathy or if this was due to her back pain she cannot give any specifics regarding her MRIs states that she has had pain and problems with her legs for quite some time.

## 2018-12-22 NOTE — Patient Instructions (Signed)
We will call with lab results Medications refilled  Schedule physical in 3-4 months  Release of records- [facility]

## 2018-12-22 NOTE — Assessment & Plan Note (Signed)
I will obtain records from her psychiatrist so we can reconcile her medication list.  As well as her pain doctor.
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
## 2018-12-22 NOTE — Assessment & Plan Note (Signed)
Urine estrogen cream typically just with intercourse.

## 2018-12-26 ENCOUNTER — Encounter (INDEPENDENT_AMBULATORY_CARE_PROVIDER_SITE_OTHER): Payer: Self-pay

## 2018-12-26 ENCOUNTER — Other Ambulatory Visit: Payer: Medicaid Other

## 2018-12-26 ENCOUNTER — Other Ambulatory Visit: Payer: Self-pay | Admitting: *Deleted

## 2018-12-26 DIAGNOSIS — R768 Other specified abnormal immunological findings in serum: Secondary | ICD-10-CM

## 2018-12-28 LAB — CBC WITH DIFFERENTIAL/PLATELET
Absolute Monocytes: 732 cells/uL (ref 200–950)
BASOS PCT: 0.4 %
Basophils Absolute: 50 cells/uL (ref 0–200)
EOS ABS: 124 {cells}/uL (ref 15–500)
Eosinophils Relative: 1 %
HCT: 42.5 % (ref 35.0–45.0)
Hemoglobin: 14.4 g/dL (ref 11.7–15.5)
Lymphs Abs: 2430 cells/uL (ref 850–3900)
MCH: 29.2 pg (ref 27.0–33.0)
MCHC: 33.9 g/dL (ref 32.0–36.0)
MCV: 86.2 fL (ref 80.0–100.0)
MONOS PCT: 5.9 %
MPV: 11.8 fL (ref 7.5–12.5)
NEUTROS PCT: 73.1 %
Neutro Abs: 9064 cells/uL — ABNORMAL HIGH (ref 1500–7800)
PLATELETS: 277 10*3/uL (ref 140–400)
RBC: 4.93 10*6/uL (ref 3.80–5.10)
RDW: 13.1 % (ref 11.0–15.0)
TOTAL LYMPHOCYTE: 19.6 %
WBC: 12.4 10*3/uL — ABNORMAL HIGH (ref 3.8–10.8)

## 2018-12-28 LAB — COMPREHENSIVE METABOLIC PANEL
AG Ratio: 1.2 (calc) (ref 1.0–2.5)
ALT: 29 U/L (ref 6–29)
AST: 24 U/L (ref 10–35)
Albumin: 4.2 g/dL (ref 3.6–5.1)
Alkaline phosphatase (APISO): 95 U/L (ref 33–130)
BILIRUBIN TOTAL: 0.3 mg/dL (ref 0.2–1.2)
BUN: 17 mg/dL (ref 7–25)
CALCIUM: 10.2 mg/dL (ref 8.6–10.4)
CO2: 26 mmol/L (ref 20–32)
Chloride: 104 mmol/L (ref 98–110)
Creat: 0.92 mg/dL (ref 0.50–0.99)
Globulin: 3.5 g/dL (calc) (ref 1.9–3.7)
Glucose, Bld: 90 mg/dL (ref 65–99)
POTASSIUM: 4.6 mmol/L (ref 3.5–5.3)
SODIUM: 140 mmol/L (ref 135–146)
TOTAL PROTEIN: 7.7 g/dL (ref 6.1–8.1)

## 2018-12-28 LAB — LIPID PANEL
Cholesterol: 158 mg/dL (ref <200)
HDL: 61 mg/dL (ref >50)
LDL CHOLESTEROL (CALC): 80 mg/dL
NON-HDL CHOLESTEROL (CALC): 97 mg/dL (ref <130)
Total CHOL/HDL Ratio: 2.6 (calc) (ref <5.0)
Triglycerides: 90 mg/dL (ref <150)

## 2018-12-28 LAB — HEPATITIS C RNA QUANTITATIVE
HCV QUANT LOG: 5.81 {Log_IU}/mL — AB
HCV RNA, PCR, QN: 646000 IU/mL — ABNORMAL HIGH

## 2018-12-28 LAB — HEPATITIS C ANTIBODY
Hepatitis C Ab: REACTIVE — AB
SIGNAL TO CUT-OFF: 34 — AB (ref <1.00)

## 2018-12-28 LAB — HCV RNA,QUANTITATIVE REAL TIME PCR
HCV Quantitative Log: 5.34 Log IU/mL — ABNORMAL HIGH
HCV RNA, PCR, QN: 220000 IU/mL — ABNORMAL HIGH

## 2018-12-29 ENCOUNTER — Other Ambulatory Visit: Payer: Self-pay | Admitting: *Deleted

## 2018-12-29 DIAGNOSIS — B1710 Acute hepatitis C without hepatic coma: Secondary | ICD-10-CM

## 2019-01-08 ENCOUNTER — Telehealth: Payer: Self-pay | Admitting: *Deleted

## 2019-01-08 NOTE — Telephone Encounter (Signed)
Received request from pharmacy for PA on Lyrica.   PA submitted via [employer].   Received immediate response.   PA [id number] approved through 01/09/2020.

## 2019-01-18 ENCOUNTER — Ambulatory Visit (INDEPENDENT_AMBULATORY_CARE_PROVIDER_SITE_OTHER): Payer: Medicaid Other | Admitting: Family

## 2019-01-18 ENCOUNTER — Encounter: Payer: Self-pay | Admitting: Family

## 2019-01-18 VITALS — BP 126/75 | HR 99 | Temp 98.3°F | Wt 205.0 lb

## 2019-01-18 DIAGNOSIS — B182 Chronic viral hepatitis C: Secondary | ICD-10-CM | POA: Diagnosis not present
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
## 2019-01-18 NOTE — Patient Instructions (Signed)
Nice to meet you!  We will check your blood work today.  Limit acetaminophen (Tylenol) usage to no more than 2 grams (2,000 mg) per day.  Avoid alcohol.  Do not share toothbrushes or razors.  Practice safe sex to protect against transmission as well as sexually transmitted disease.    Hepatitis C Hepatitis C is a viral infection of the liver. It can lead to scarring of the liver (cirrhosis), liver failure, or liver cancer. Hepatitis C may go undetected for months or years because people with the infection may not have symptoms, or they may have only mild symptoms. What are the causes? This condition is caused by the hepatitis C virus (HCV). The virus can spread from person to person (is contagious) through:  Blood.  Childbirth. A woman who has hepatitis C can pass it to her baby during birth.  Bodily fluids, such as breast milk, tears, semen, vaginal fluids, and saliva.  Blood transfusions or organ transplants done in the [place] before 1992.  What increases the risk? The following factors may make you more likely to develop this condition:  Having contact with unclean (contaminated) needles or syringes. This may result from: ? Acupuncture. ? Tattoing. ? Body piercing. ? Injecting drugs.  Having unprotected sex with someone who is infected.  Needing treatment to filter your blood (kidney dialysis).  Having HIV (human immunodeficiency virus) or AIDS (acquired immunodeficiency syndrome).  Working in a job that involves contact with blood or bodily fluids, such as health care.  What are the signs or symptoms? Symptoms of this condition include:  Fatigue.  Loss of appetite.  Nausea.  Vomiting.  Abdominal pain.  Dark yellow urine.  Yellowish skin and eyes (jaundice).  Itchy skin.  Clay-colored bowel movements.  Joint pain.  Bleeding and bruising easily.  Fluid building up in your stomach (ascites).  In some cases, you may not have any symptoms.  How is this diagnosed? This condition is diagnosed with:  Blood tests.  Other tests to check how well your liver is functioning. They may include: ? Magnetic resonance elastography (MRE). This imaging test uses MRIs and sound waves to measure liver stiffness. ? Transient elastography. This imaging test uses ultrasounds to measure liver stiffness. ? Liver biopsy. This test requires taking a small tissue sample from your liver to examine it under a microscope.  How is this treated? Your health care provider may perform noninvasive tests or a liver biopsy to help decide the best course of treatment. Treatment may include:  Antiviral medicines and other medicines.  Follow-up treatments every 6-12 months for infections or other liver conditions.  Receiving a donated liver (liver transplant).  Follow these instructions at home: Medicines  Take over-the-counter and prescription medicines only as told by your health care provider.  Take your antiviral medicine as told by your health care provider. Do not stop taking the antiviral even if you start to feel better.  Do not take any medicines unless approved by your health care provider, including over-the-counter medicines and birth control pills. Activity  Rest as needed.  Do not have sex unless approved by your health care provider.  Ask your health care provider when you may return to school or work. Eating and drinking  Eat a balanced diet with plenty of fruits and vegetables, whole grains, and lowfat (lean) meats or non-meat proteins (such as beans or tofu).  Drink enough fluids to keep your urine clear or pale yellow.  Do not drink alcohol. General instructions    Do not share toothbrushes, nail clippers, or razors.  Wash your hands frequently with soap and water. If soap and water are not available, use hand sanitizer.  Cover any cuts or open sores on your skin to prevent spreading the virus.  Keep all follow-up visits as  told by your health care provider. This is important. You may need follow-up visits every 6-12 months. How is this prevented? There is no vaccine for hepatitis C. The only way to prevent the disease is to reduce the risk of exposure to the virus. Make sure you:  Wash your hands frequently with soap and water. If soap and water are not available, use hand sanitizer.  Do not share needles or syringes.  Practice safe sex and use condoms.  Avoid handling blood or bodily fluids without gloves or other protection.  Avoid getting tattoos or piercings in shops or other locations that are not clean.  Contact a health care provider if:  You have a fever.  You develop abdominal pain.  You pass dark urine.  You pass clay-colored stools.  You develop joint pain. Get help right away if:  You have increasing fatigue or weakness.  You lose your appetite.  You cannot eat or drink without vomiting.  You develop jaundice or your jaundice gets worse.  You bruise or bleed easily. Summary  Hepatitis C is a viral infection of the liver. It can lead to scarring of the liver (cirrhosis), liver failure, or liver cancer.  The hepatitis C virus (HCV) causes this condition. The virus can pass from person to person (is contagious).  You should not take any medicines unless approved by your health care provider. This includes over-the-counter medicines and birth control pills. This information is not intended to replace advice given to you by your health care provider. Make sure you discuss any questions you have with your health care provider. Document Released: 12/10/2000 Document Revised: 01/18/2017 Document Reviewed: 01/18/2017 Elsevier Interactive Patient Education  2018 Elsevier Inc.  

## 2019-01-18 NOTE — Assessment & Plan Note (Signed)
Makayla Lin has chronic Hepatitis C with risk factors for acquiring Hepatitis C including cocaine use in the past. She has been clean for over 10 years presently. Initial viral load of 220,000. Will obtain genotype and Fibrotest along with Hepatitis B status. She is low risk for fibrosis at present with APRI score of 0.217 and FIB4 of 0.98. Discussed the acquisition, transmission, risk if left untreated, and treatments associated with Hepatitis C. She wishes to proceed with treatment. She has completed financial assistance paperwork today. Will plan for treatment with 8 weeks of Mavyret or 12 weeks of Epclusa pending blood work results.

## 2019-01-23 LAB — LIVER FIBROSIS, FIBROTEST-ACTITEST
ALT: 28 U/L (ref 6–29)
Alpha-2-Macroglobulin: 286 mg/dL — ABNORMAL HIGH (ref 106–279)
Apolipoprotein A1: 160 mg/dL (ref 101–198)
Bilirubin: 0.2 mg/dL (ref 0.2–1.2)
Fibrosis Score: 0.2
GGT: 39 U/L (ref 3–65)
Haptoglobin: 162 mg/dL (ref 43–212)
Necroinflammat ACT Score: 0.12
Reference ID: 2825198

## 2019-01-23 LAB — HEPATITIS C GENOTYPE

## 2019-01-23 LAB — HEPATITIS B SURFACE ANTIBODY,QUALITATIVE: Hep B S Ab: NONREACTIVE

## 2019-01-23 LAB — HEPATITIS B SURFACE ANTIGEN: Hepatitis B Surface Ag: NONREACTIVE

## 2019-01-24 ENCOUNTER — Other Ambulatory Visit: Payer: Self-pay | Admitting: Family

## 2019-01-24 MED ORDER — GLECAPREVIR-PIBRENTASVIR 100-40 MG PO TABS: 3.0000 | ORAL_TABLET | Freq: Every day | ORAL | 1 refills | Status: DC

## 2019-01-30 ENCOUNTER — Other Ambulatory Visit: Payer: Self-pay | Admitting: Pharmacist

## 2019-01-30 DIAGNOSIS — B182 Chronic viral hepatitis C: Secondary | ICD-10-CM

## 2019-01-30 MED ORDER — GLECAPREVIR-PIBRENTASVIR 100-40 MG PO TABS: 3.0000 | ORAL_TABLET | Freq: Every day | ORAL | 1 refills | Status: DC

## 2019-01-30 NOTE — Progress Notes (Signed)
Resending Mavyret to take with food. Makayla Lin will work on [place].

## 2019-02-02 ENCOUNTER — Telehealth: Payer: Self-pay | Admitting: Pharmacy Technician

## 2019-02-02 ENCOUNTER — Telehealth: Payer: Self-pay | Admitting: Pharmacist

## 2019-02-02 NOTE — Telephone Encounter (Signed)
[facility] Patient Advocate Encounter  Prior Authorization for MAVRET has been approved.    Effective dates: 02/02/2019 through 04/03/2019  Patients co-pay is $3.00.   [facility] will continue follow up with patient to counsel on refill prescription procedure.  Bartholomew Crews, CPhT Specialty Pharmacy Patient [facility] for Infectious Disease Phone: [phone] Fax: [phone] 02/02/2019 3:26 PM

## 2019-02-02 NOTE — Telephone Encounter (Signed)
Patient is approved to receive Mavyret x 8 weeks for chronic Hepatitis C infection. Counseled patient to take all three tablets of Mavyret daily with food.  Counseled patient the need to take all three tablets together and to not separate them out during the day. Encouraged patient not to miss any doses and explained how their chance of cure could go down with each dose missed. Counseled patient on what to do if dose is missed - if it is closer to the missed dose take immediately; if closer to next dose then skip dose and take the next dose at the usual time.   Counseled patient on common symptoms including headache, fatigue, and nausea and that the symptoms normally decrease with time. I reviewed patient medications and found no drug interactions. Discussed with patient that there are several drug interactions with Mavyret and instructed patient to call the clinic if she wishes to start a new medication during course of therapy. Also advised patient to call if she experiences any side effects. Patient will follow-up with me in the pharmacy clinic on 3/11.

## 2019-02-02 NOTE — Telephone Encounter (Signed)
[facility] Patient Advocate Encounter   Received notification from [facility] Medicaid that prior authorization for Makayla Lin is required.   PA submitted on 02/02/2019 [place] [id number] W Status is pending    [facility] will continue to follow.  Bartholomew Crews, CPhT Specialty Pharmacy Patient [facility] for Infectious Disease Phone: [phone] Fax: [phone] 02/02/2019 11:53 AM

## 2019-02-02 NOTE — Telephone Encounter (Signed)
Awesome! Thanks

## 2019-02-05 MED FILL — MAVYRET 100-40 MG TABS: 100-40 | 28 days supply | Qty: 84 | Fill #0

## 2019-02-21 ENCOUNTER — Ambulatory Visit: Payer: Medicaid Other | Admitting: Family

## 2019-02-26 ENCOUNTER — Telehealth: Payer: Self-pay | Admitting: Family Medicine

## 2019-02-26 ENCOUNTER — Ambulatory Visit: Payer: Medicaid Other | Admitting: Family Medicine

## 2019-02-26 ENCOUNTER — Encounter: Payer: Self-pay | Admitting: Family Medicine

## 2019-02-26 VITALS — BP 110/60 | HR 110 | Temp 98.9°F | Resp 18 | Ht 62.0 in | Wt 205.0 lb

## 2019-02-26 DIAGNOSIS — K529 Noninfective gastroenteritis and colitis, unspecified: Secondary | ICD-10-CM

## 2019-02-26 MED ORDER — DIPHENOXYLATE-ATROPINE 2.5-0.025 MG PO TABS: 1.0000 | ORAL_TABLET | Freq: Four times a day (QID) | ORAL | 0 refills | Status: DC | PRN

## 2019-02-26 MED ORDER — PROMETHAZINE HCL 12.5 MG PO TABS: 12.5000 mg | ORAL_TABLET | Freq: Three times a day (TID) | ORAL | 0 refills | Status: DC | PRN
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
## 2019-02-26 NOTE — Telephone Encounter (Signed)
Patient called in today with c/o diarrhea since last Thursday. Patient states that she has tried Pepto and Immodium with no relief. Patient concerned that symptoms may be coming from new medication Mavyret that was prescribed by infectious disease. Advised patient to come in for an office visit as diarrhea may be coming from condition. Patient scheduled for 0915

## 2019-02-28 MED FILL — MAVYRET 100-40 MG TABS: 100-40 | 28 days supply | Qty: 84 | Fill #1

## 2019-03-07 ENCOUNTER — Ambulatory Visit: Payer: Medicaid Other | Admitting: Pharmacist

## 2019-03-20 ENCOUNTER — Ambulatory Visit (INDEPENDENT_AMBULATORY_CARE_PROVIDER_SITE_OTHER): Payer: Medicaid Other | Admitting: Family Medicine

## 2019-03-20 ENCOUNTER — Encounter: Payer: Self-pay | Admitting: Family Medicine

## 2019-03-20 ENCOUNTER — Other Ambulatory Visit: Payer: Self-pay

## 2019-03-20 DIAGNOSIS — J441 Chronic obstructive pulmonary disease with (acute) exacerbation: Secondary | ICD-10-CM | POA: Diagnosis not present

## 2019-03-20 DIAGNOSIS — Z720 Tobacco use: Secondary | ICD-10-CM | POA: Diagnosis not present

## 2019-03-20 MED ORDER — BENZONATATE 100 MG PO CAPS: 100.0000 mg | ORAL_CAPSULE | Freq: Three times a day (TID) | ORAL | 0 refills | Status: DC | PRN

## 2019-03-20 MED ORDER — AZITHROMYCIN 250 MG PO TABS: ORAL_TABLET | ORAL | 0 refills | Status: DC

## 2019-03-20 MED ORDER — PREDNISONE 10 MG PO TABS: ORAL_TABLET | ORAL | 0 refills | Status: DC

## 2019-03-20 MED ORDER — ALBUTEROL SULFATE HFA 108 (90 BASE) MCG/ACT IN AERS: 2.0000 | INHALATION_SPRAY | RESPIRATORY_TRACT | 0 refills | Status: DC | PRN

## 2019-03-20 NOTE — Progress Notes (Signed)
Virtual Visit via Telephone Note  I connected with Makayla Lin on 03/20/19 at  1:03pm  by telephone and verified that I am speaking with the correct person using two identifiers.  Patient Location- at Home Physician Location- Vic Blackbird MD, [facility]    I discussed the limitations, risks, security and privacy concerns of performing an evaluation and management service by telephone and the availability of in person appointments. I also discussed with the patient that there may be a patient responsible charge related to this service. The patient expressed understanding and agreed to proceed.    History of Present Illness:  Cough with production for the past 2-3 days. Has had wheezing throughout the night. No DOE. But feels a little SOB when she lays down at timesNo fever. + nasal drainage, no Sinus headache or pain  No vomiting or diarrhea  +sick contact with someone with bronchitis No travel  She has been taking mucinex , tessalon perrles which has helped   Pt has underlying COPD, continues to smokle does not have inhaler   Meds reviewed     Observations/Objective:  No obvious SOB on phone, mild cough noted, speaking in full sentences, normal mentation with questioning   Assessment and Plan: COPD exacerbation - treat with zpak, tessalon, prednisone, albuterol   Follow Up Instructions:    I discussed the assessment and treatment plan with the patient. The patient was provided an opportunity to ask questions and all were answered. The patient agreed with the plan and demonstrated an understanding of the instructions.   The patient was advised to call back or seek an in-person evaluation if the symptoms worsen or if the condition fails to improve as anticipated.  I provided 10 minutes of non-face-to-face time during this encounter. Phone called ended 1:13pm   Vic Blackbird, MD

## 2019-03-27 ENCOUNTER — Encounter: Payer: Medicaid Other | Admitting: Family Medicine

## 2019-04-18 ENCOUNTER — Encounter: Payer: Self-pay | Admitting: Pharmacy Technician

## 2019-04-20 ENCOUNTER — Encounter: Payer: Self-pay | Admitting: Family Medicine

## 2019-04-27 ENCOUNTER — Encounter: Payer: Medicaid Other | Admitting: Family Medicine

## 2019-05-18 ENCOUNTER — Encounter: Payer: Self-pay | Admitting: Family Medicine

## 2019-05-18 ENCOUNTER — Other Ambulatory Visit: Payer: Self-pay | Admitting: Family Medicine

## 2019-05-18 DIAGNOSIS — G6289 Other specified polyneuropathies: Secondary | ICD-10-CM

## 2019-05-18 MED ORDER — PREGABALIN 100 MG PO CAPS: 100.0000 mg | ORAL_CAPSULE | Freq: Three times a day (TID) | ORAL | 1 refills | Status: DC

## 2019-05-20 ENCOUNTER — Other Ambulatory Visit: Payer: Self-pay | Admitting: Family Medicine

## 2019-05-20 DIAGNOSIS — G6289 Other specified polyneuropathies: Secondary | ICD-10-CM

## 2019-05-22 ENCOUNTER — Other Ambulatory Visit: Payer: Self-pay

## 2019-05-22 DIAGNOSIS — G6289 Other specified polyneuropathies: Secondary | ICD-10-CM

## 2019-05-22 MED ORDER — PREGABALIN 100 MG PO CAPS: 100.0000 mg | ORAL_CAPSULE | Freq: Three times a day (TID) | ORAL | 1 refills | Status: DC

## 2019-05-25 ENCOUNTER — Other Ambulatory Visit: Payer: Self-pay | Admitting: Family Medicine

## 2019-05-25 DIAGNOSIS — G6289 Other specified polyneuropathies: Secondary | ICD-10-CM

## 2019-05-25 MED ORDER — PREGABALIN 100 MG PO CAPS: 100.0000 mg | ORAL_CAPSULE | Freq: Three times a day (TID) | ORAL | 1 refills | Status: DC

## 2019-06-01 ENCOUNTER — Emergency Department (HOSPITAL_COMMUNITY): Payer: Medicaid Other

## 2019-06-01 ENCOUNTER — Emergency Department (HOSPITAL_COMMUNITY)
Admission: EM | Admit: 2019-06-01 | Discharge: 2019-06-01 | Disposition: A | Discharge status: Home / Self Care | Payer: Medicaid Other | Attending: Emergency Medicine | Admitting: Emergency Medicine

## 2019-06-01 ENCOUNTER — Ambulatory Visit (HOSPITAL_COMMUNITY)
Admission: EM | Admit: 2019-06-01 | Discharge: 2019-06-01 | Disposition: A | Discharge status: Home / Self Care | Payer: Medicaid Other | Attending: Family Medicine | Admitting: Family Medicine

## 2019-06-01 ENCOUNTER — Encounter (HOSPITAL_COMMUNITY): Payer: Self-pay

## 2019-06-01 ENCOUNTER — Telehealth: Payer: Self-pay | Admitting: Family Medicine

## 2019-06-01 ENCOUNTER — Other Ambulatory Visit: Payer: Self-pay

## 2019-06-01 DIAGNOSIS — R569 Unspecified convulsions: Secondary | ICD-10-CM | POA: Diagnosis not present

## 2019-06-01 DIAGNOSIS — G252 Other specified forms of tremor: Secondary | ICD-10-CM

## 2019-06-01 DIAGNOSIS — R251 Tremor, unspecified: Secondary | ICD-10-CM

## 2019-06-01 DIAGNOSIS — F17210 Nicotine dependence, cigarettes, uncomplicated: Secondary | ICD-10-CM | POA: Insufficient documentation

## 2019-06-01 DIAGNOSIS — J449 Chronic obstructive pulmonary disease, unspecified: Secondary | ICD-10-CM | POA: Insufficient documentation

## 2019-06-01 DIAGNOSIS — I10 Essential (primary) hypertension: Secondary | ICD-10-CM | POA: Insufficient documentation

## 2019-06-01 DIAGNOSIS — Z79899 Other long term (current) drug therapy: Secondary | ICD-10-CM | POA: Diagnosis not present

## 2019-06-01 DIAGNOSIS — N390 Urinary tract infection, site not specified: Secondary | ICD-10-CM

## 2019-06-01 LAB — COMPREHENSIVE METABOLIC PANEL
ALT: 17 U/L (ref 0–44)
AST: 15 U/L (ref 15–41)
Albumin: 3.6 g/dL (ref 3.5–5.0)
Alkaline Phosphatase: 75 U/L (ref 38–126)
Anion gap: 8 (ref 5–15)
BUN: 8 mg/dL (ref 8–23)
CO2: 25 mmol/L (ref 22–32)
Calcium: 9 mg/dL (ref 8.9–10.3)
Chloride: 107 mmol/L (ref 98–111)
Creatinine, Ser: 1 mg/dL (ref 0.44–1.00)
GFR calc Af Amer: >60 mL/min (ref >60)
GFR calc non Af Amer: >60 mL/min (ref >60)
Glucose, Bld: 111 mg/dL — ABNORMAL HIGH (ref 70–99)
Potassium: 3.9 mmol/L (ref 3.5–5.1)
Sodium: 140 mmol/L (ref 135–145)
Total Bilirubin: 0.4 mg/dL (ref 0.3–1.2)
Total Protein: 7 g/dL (ref 6.5–8.1)

## 2019-06-01 LAB — CBC WITH DIFFERENTIAL/PLATELET
Abs Immature Granulocytes: 0.04 10*3/uL (ref 0.00–0.07)
Basophils Absolute: 0 10*3/uL (ref 0.0–0.1)
Basophils Relative: 0 %
Eosinophils Absolute: 0.1 10*3/uL (ref 0.0–0.5)
Eosinophils Relative: 1 %
HCT: 40.9 % (ref 36.0–46.0)
Hemoglobin: 12.7 g/dL (ref 12.0–15.0)
Immature Granulocytes: 0 %
Lymphocytes Relative: 24 %
Lymphs Abs: 2.5 10*3/uL (ref 0.7–4.0)
MCH: 28.5 pg (ref 26.0–34.0)
MCHC: 31.1 g/dL (ref 30.0–36.0)
MCV: 91.7 fL (ref 80.0–100.0)
Monocytes Absolute: 0.6 10*3/uL (ref 0.1–1.0)
Monocytes Relative: 6 %
Neutro Abs: 7.3 10*3/uL (ref 1.7–7.7)
Neutrophils Relative %: 69 %
Platelets: 239 10*3/uL (ref 150–400)
RBC: 4.46 MIL/uL (ref 3.87–5.11)
RDW: 14.6 % (ref 11.5–15.5)
WBC: 10.5 10*3/uL (ref 4.0–10.5)
nRBC: 0 % (ref 0.0–0.2)

## 2019-06-01 LAB — RAPID URINE DRUG SCREEN, HOSP PERFORMED
Amphetamines: NOT DETECTED
Barbiturates: NOT DETECTED
Benzodiazepines: NOT DETECTED
Cocaine: NOT DETECTED
Opiates: POSITIVE — AB
Tetrahydrocannabinol: NOT DETECTED

## 2019-06-01 LAB — URINALYSIS, ROUTINE W REFLEX MICROSCOPIC
Bilirubin Urine: NEGATIVE
Glucose, UA: NEGATIVE mg/dL
Hgb urine dipstick: NEGATIVE
Ketones, ur: NEGATIVE mg/dL
Nitrite: NEGATIVE
Protein, ur: NEGATIVE mg/dL
Specific Gravity, Urine: 1.011 (ref 1.005–1.030)
pH: 5 (ref 5.0–8.0)

## 2019-06-01 MED ORDER — LORAZEPAM 1 MG PO TABS
1.0000 mg | ORAL_TABLET | Freq: Once | ORAL | Status: AC
  Administered 2019-06-01: 1 mg via ORAL
  Filled 2019-06-01: qty 1

## 2019-06-01 MED ORDER — CEPHALEXIN 500 MG PO CAPS: 500.0000 mg | ORAL_CAPSULE | Freq: Four times a day (QID) | ORAL | 0 refills | Status: DC

## 2019-06-01 MED ORDER — LORAZEPAM 1 MG PO TABS: 1.0000 mg | ORAL_TABLET | Freq: Two times a day (BID) | ORAL | Status: DC | PRN

## 2019-06-01 MED ORDER — CEPHALEXIN 250 MG PO CAPS
500.0000 mg | ORAL_CAPSULE | Freq: Once | ORAL | Status: AC
  Administered 2019-06-01: 500 mg via ORAL
  Filled 2019-06-01: qty 2

## 2019-06-01 NOTE — ED Triage Notes (Signed)
[facility] sent [facility] from [facility] for evaluation of tremors that started 3 days ago, no other symptoms. Pt has hx of seizures 10 years ago but states she does not feel like she is going to have a seizure. Pt a.o, nad noted.

## 2019-06-01 NOTE — ED Notes (Signed)
Pt in CT.
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
## 2019-06-01 NOTE — Telephone Encounter (Signed)
Patient called in stating that she has been shaking uncontrollably for the past few days. Denies any other symptoms such as headache, aura's , confusion. States that she has been eating and drinking as normal as well as taking medications as prescribed. She advised me that she has a hx of seizures in the past and feels like a seizure may be nearing. Advised patient to go to the ER for further evaluation. Patient verbalized understanding.
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
## 2019-06-01 NOTE — Discharge Instructions (Signed)
Please go to the ER for further evaluation of your tremors.

## 2019-06-01 NOTE — ED Triage Notes (Signed)
Pt presents with body tremors for past 3 days; pt states she has a seizure disorder, she has had really bad tremors that will not go away.

## 2019-06-06 ENCOUNTER — Ambulatory Visit: Payer: Medicaid Other | Admitting: Family Medicine

## 2019-08-01 ENCOUNTER — Encounter: Payer: Self-pay | Admitting: Family Medicine

## 2019-08-01 ENCOUNTER — Ambulatory Visit (INDEPENDENT_AMBULATORY_CARE_PROVIDER_SITE_OTHER): Payer: Medicaid Other | Admitting: Family Medicine

## 2019-08-01 ENCOUNTER — Other Ambulatory Visit: Payer: Self-pay

## 2019-08-01 VITALS — BP 112/68 | HR 62 | Temp 98.5°F | Resp 14 | Ht 62.0 in | Wt 205.0 lb

## 2019-08-01 DIAGNOSIS — Z1239 Encounter for other screening for malignant neoplasm of breast: Secondary | ICD-10-CM

## 2019-08-01 DIAGNOSIS — G6289 Other specified polyneuropathies: Secondary | ICD-10-CM

## 2019-08-01 DIAGNOSIS — B182 Chronic viral hepatitis C: Secondary | ICD-10-CM | POA: Diagnosis not present

## 2019-08-01 DIAGNOSIS — E669 Obesity, unspecified: Secondary | ICD-10-CM | POA: Insufficient documentation

## 2019-08-01 DIAGNOSIS — Z0001 Encounter for general adult medical examination with abnormal findings: Secondary | ICD-10-CM | POA: Diagnosis not present

## 2019-08-01 DIAGNOSIS — I10 Essential (primary) hypertension: Secondary | ICD-10-CM

## 2019-08-01 DIAGNOSIS — Z6837 Body mass index (BMI) 37.0-37.9, adult: Secondary | ICD-10-CM

## 2019-08-01 DIAGNOSIS — Z124 Encounter for screening for malignant neoplasm of cervix: Secondary | ICD-10-CM

## 2019-08-01 DIAGNOSIS — Z0000 Encounter for general adult medical examination without abnormal findings: Secondary | ICD-10-CM

## 2019-08-01 MED ORDER — PREGABALIN 100 MG PO CAPS: 100.0000 mg | ORAL_CAPSULE | Freq: Three times a day (TID) | ORAL | 2 refills | Status: DC

## 2019-08-01 MED ORDER — LOSARTAN POTASSIUM 100 MG PO TABS: 100.0000 mg | ORAL_TABLET | Freq: Every day | ORAL | 3 refills | Status: DC

## 2019-08-01 NOTE — Patient Instructions (Signed)
We will call with lab results  Referral to Hep C clinic to re-establish care Call for your mammogram  F/U 6 months

## 2019-08-01 NOTE — Progress Notes (Signed)
   Subjective:    Patient ID: Makayla Lin, female    DOB: [birth year], 63 y.o.   MRN: [id number]  Patient presents for Gynecologic Exam (is fasting)  Pt here for CPE  Medications reviewed   CHronic pain- followed by pain clinic     Post menopausal    Due for mammogram, last done in 2018 Colonoscopy- UTD    Immunizations- TDAP UTD   Due for fasting labs  She was being followed by Hep C , treated wit h8 weeks Mavyret but then missed follow up appt   HTN- she has been taking losartan without any difficulty   Review Of Systems:  GEN- denies fatigue, fever, weight loss,weakness, recent illness HEENT- denies eye drainage, change in vision, nasal discharge, CVS- denies chest pain, palpitations RESP- denies SOB, cough, wheeze ABD- denies N/V, change in stools, abd pain GU- denies dysuria, hematuria, dribbling, incontinence MSK- denies joint pain, muscle aches, injury Neuro- denies headache, dizziness, syncope, seizure activity       Objective:    BP 112/68   Pulse 62   Temp 98.5 F (36.9 C) (Oral)   Resp 14   Ht 5\' 2"  (1.575 m)   Wt 205 lb (93 kg)   SpO2 97%   BMI 37.49 kg/m  GEN- NAD, alert and oriented x3 HEENT- PERRL, EOMI, non injected sclera, pink conjunctiva, MMM, oropharynx clear Neck- Supple, no thyromegaly CVS- RRR, no murmur RESP-CTAB ABD-NABS,soft,NT,ND Breast exam- pt declined  GU- normal external genitalia, vaginal atrophy t, cervix visualized no growth, no blood form os, minimal thin clear discharge, no CMT, no ovarian masses, uterus normal size Rectum- normal tone, FOBT neg  EXT- No edema Pulses- Radial, DP- 2+        Assessment & Plan:      Problem List Items Addressed This Visit      Unprioritized   Chronic hepatitis C without hepatic coma (HCC)    Recheck viral load, re establish with hep c clinic      Relevant Orders   Comprehensive metabolic panel (Completed)   Hepatitis C RNA quantitative   Hypertension - Primary   Controlled no changes to meds       Relevant Medications   losartan (COZAAR) 100 MG tablet   Other Relevant Orders   CBC with Differential/Platelet (Completed)   Comprehensive metabolic panel (Completed)   Lipid panel (Completed)   Obese   Peripheral neuropathy    Continue lyrica      Relevant Medications   pregabalin (LYRICA) 100 MG capsule    Other Visit Diagnoses    Routine general medical examination at a health care facility       CPE done, PAP Done, pt to schedule mammogram, fasting labs    Relevant Orders   CBC with Differential/Platelet (Completed)   Comprehensive metabolic panel (Completed)   Breast cancer screening       Relevant Orders   MM 3D SCREEN BREAST BILATERAL   Cervical cancer screening       Relevant Orders   Pap IG w/ reflex to HPV when ASC-U      Note: This dictation was prepared with Dragon dictation along with smaller phrase technology. Any transcriptional errors that result from this process are unintentional.

## 2019-08-02 ENCOUNTER — Encounter: Payer: Self-pay | Admitting: Family Medicine

## 2019-08-02 LAB — PAP IG W/ RFLX HPV ASCU

## 2019-08-02 LAB — CBC WITH DIFFERENTIAL/PLATELET
Absolute Monocytes: 903 cells/uL (ref 200–950)
Basophils Absolute: 39 cells/uL (ref 0–200)
Basophils Relative: 0.3 %
Eosinophils Absolute: 258 cells/uL (ref 15–500)
Eosinophils Relative: 2 %
HCT: 39.6 % (ref 35.0–45.0)
Hemoglobin: 13.2 g/dL (ref 11.7–15.5)
Lymphs Abs: 2903 cells/uL (ref 850–3900)
MCH: 29.1 pg (ref 27.0–33.0)
MCHC: 33.3 g/dL (ref 32.0–36.0)
MCV: 87.2 fL (ref 80.0–100.0)
MPV: 11.7 fL (ref 7.5–12.5)
Monocytes Relative: 7 %
Neutro Abs: 8798 cells/uL — ABNORMAL HIGH (ref 1500–7800)
Neutrophils Relative %: 68.2 %
Platelets: 262 10*3/uL (ref 140–400)
RBC: 4.54 10*6/uL (ref 3.80–5.10)
RDW: 13.2 % (ref 11.0–15.0)
Total Lymphocyte: 22.5 %
WBC: 12.9 10*3/uL — ABNORMAL HIGH (ref 3.8–10.8)

## 2019-08-02 LAB — COMPREHENSIVE METABOLIC PANEL
AG Ratio: 1.2 (calc) (ref 1.0–2.5)
ALT: 14 U/L (ref 6–29)
AST: 17 U/L (ref 10–35)
Albumin: 3.8 g/dL (ref 3.6–5.1)
Alkaline phosphatase (APISO): 83 U/L (ref 37–153)
BUN: 15 mg/dL (ref 7–25)
CO2: 29 mmol/L (ref 20–32)
Calcium: 9.2 mg/dL (ref 8.6–10.4)
Chloride: 105 mmol/L (ref 98–110)
Creat: 0.94 mg/dL (ref 0.50–0.99)
Globulin: 3.3 g/dL (calc) (ref 1.9–3.7)
Glucose, Bld: 71 mg/dL (ref 65–99)
Potassium: 4.4 mmol/L (ref 3.5–5.3)
Sodium: 142 mmol/L (ref 135–146)
Total Bilirubin: 0.2 mg/dL (ref 0.2–1.2)
Total Protein: 7.1 g/dL (ref 6.1–8.1)

## 2019-08-02 LAB — LIPID PANEL
Cholesterol: 142 mg/dL (ref <200)
HDL: 40 mg/dL — ABNORMAL LOW (ref >=50)
LDL Cholesterol (Calc): 73 mg/dL (calc)
Non-HDL Cholesterol (Calc): 102 mg/dL (calc) (ref <130)
Total CHOL/HDL Ratio: 3.6 (calc) (ref <5.0)
Triglycerides: 192 mg/dL — ABNORMAL HIGH (ref <150)

## 2019-08-02 NOTE — Assessment & Plan Note (Signed)
Recheck viral load, re establish with hep c clinic

## 2019-08-02 NOTE — Assessment & Plan Note (Signed)
Controlled no changes to meds 

## 2019-08-02 NOTE — Assessment & Plan Note (Signed)
Continue lyrica 

## 2019-08-07 LAB — HEPATITIS C RNA QUANTITATIVE
HCV Quantitative Log: <1.18 Log IU/mL
HCV RNA, PCR, QN: <15 IU/mL

## 2019-11-19 ENCOUNTER — Encounter: Payer: Self-pay | Admitting: Family Medicine

## 2019-11-19 ENCOUNTER — Other Ambulatory Visit: Payer: Self-pay

## 2019-11-19 ENCOUNTER — Ambulatory Visit: Payer: Medicaid Other | Admitting: Family Medicine

## 2019-11-19 VITALS — BP 150/88 | HR 100 | Temp 98.7°F | Resp 16 | Ht 62.0 in | Wt 210.0 lb

## 2019-11-19 DIAGNOSIS — I10 Essential (primary) hypertension: Secondary | ICD-10-CM | POA: Diagnosis not present

## 2019-11-19 DIAGNOSIS — R252 Cramp and spasm: Secondary | ICD-10-CM | POA: Diagnosis not present

## 2019-11-19 DIAGNOSIS — Z6838 Body mass index (BMI) 38.0-38.9, adult: Secondary | ICD-10-CM

## 2019-11-19 DIAGNOSIS — G6289 Other specified polyneuropathies: Secondary | ICD-10-CM

## 2019-11-19 MED ORDER — PREGABALIN 100 MG PO CAPS: 100.0000 mg | ORAL_CAPSULE | Freq: Three times a day (TID) | ORAL | 2 refills | Status: DC

## 2019-11-19 MED ORDER — AMLODIPINE BESYLATE 5 MG PO TABS: 5.0000 mg | ORAL_TABLET | Freq: Every day | ORAL | 3 refills | Status: DC

## 2019-11-19 NOTE — Progress Notes (Signed)
   Subjective:    Patient ID: Makayla Lin, female    DOB: 09/18/1956, 63 y.o.   MRN: [id number]  Patient presents for HTN (BP running high)   Pt sates BP has been elevated recently   her readings at home 134-169/90-11, HR 79-100 She was having some headaches, whic hwis hy she checked her blood pressures  no chest pain, getrs SOB, when she anxous, no cough or wheezing episodes    She has been having leg cramps at night, has tried mustard, tonic water and OTC cramp pills   Weight up 5lbs since our visit in August.     Review Of Systems:  GEN- denies fatigue, fever, weight loss,weakness, recent illness HEENT- denies eye drainage, change in vision, nasal discharge, CVS- denies chest pain, palpitations RESP- denies SOB, cough, wheeze ABD- denies N/V, change in stools, abd pain GU- denies dysuria, hematuria, dribbling, incontinence MSK- denies joint pain, muscle aches, injury Neuro- denies headache, dizziness, syncope, seizure activity       Objective:    BP (!) 150/88 (BP Location: Right Arm, Patient Position: Sitting, Cuff Size: Normal)   Pulse 100   Temp 98.7 F (37.1 C) (Temporal)   Resp 16   Ht 5\' 2"  (1.575 m)   Wt 210 lb (95.3 kg)   SpO2 92%   BMI 38.41 kg/m  GEN- NAD, alert and oriented x3 HEENT- PERRL, EOMI, non injected sclera, pink conjunctiva, MMM, oropharynx clear Neck- Supple, no thyromegaly CVS- RRR, no murmur RESP-CTAB ABD-NABS,soft,NT,ND EXT- No edema Pulses- Radial, DP- 2+        Assessment & Plan:      Problem List Items Addressed This Visit      Unprioritized   Hypertension - Primary    Uncontrolled we will add amlodipine 5 mg to her losartan.  We will check her labs today.  Follow-up by phone in 2 weeks she will check her blood pressure at home.  Dietary changes cutting back fried foods salty foods to help with her blood pressure and her weight.  The leg cramps I will check her potassium and magnesium level.      Relevant Medications    amLODipine (NORVASC) 5 MG tablet   Other Relevant Orders   Basic metabolic panel   CBC with Differential   Obese   Peripheral neuropathy    Lyrica refilled.      Relevant Medications   pregabalin (LYRICA) 100 MG capsule    Other Visit Diagnoses    Leg cramp       Relevant Orders   Magnesium      Note: This dictation was prepared with Dragon dictation along with smaller phrase technology. Any transcriptional errors that result from this process are unintentional.

## 2019-11-19 NOTE — Assessment & Plan Note (Signed)
Lyrica refilled

## 2019-11-19 NOTE — Patient Instructions (Signed)
F/U 2 weeks PHONE VISIT for blood pressure  Start amlodipine for blood pressure Keep taking the losartan Cut back on salty foods, fried foods

## 2019-11-19 NOTE — Assessment & Plan Note (Signed)
Uncontrolled we will add amlodipine 5 mg to her losartan.  We will check her labs today.  Follow-up by phone in 2 weeks she will check her blood pressure at home.  Dietary changes cutting back fried foods salty foods to help with her blood pressure and her weight.  The leg cramps I will check her potassium and magnesium level.

## 2019-11-20 LAB — BASIC METABOLIC PANEL
BUN/Creatinine Ratio: 17 (calc) (ref 6–22)
BUN: 17 mg/dL (ref 7–25)
CO2: 26 mmol/L (ref 20–32)
Calcium: 9.8 mg/dL (ref 8.6–10.4)
Chloride: 103 mmol/L (ref 98–110)
Creat: 1.03 mg/dL — ABNORMAL HIGH (ref 0.50–0.99)
Glucose, Bld: 101 mg/dL — ABNORMAL HIGH (ref 65–99)
Potassium: 5 mmol/L (ref 3.5–5.3)
Sodium: 141 mmol/L (ref 135–146)

## 2019-11-20 LAB — CBC WITH DIFFERENTIAL/PLATELET
Absolute Monocytes: 660 cells/uL (ref 200–950)
Basophils Absolute: 24 cells/uL (ref 0–200)
Basophils Relative: 0.2 %
Eosinophils Absolute: 168 cells/uL (ref 15–500)
Eosinophils Relative: 1.4 %
HCT: 40.8 % (ref 35.0–45.0)
Hemoglobin: 13.2 g/dL (ref 11.7–15.5)
Lymphs Abs: 2652 cells/uL (ref 850–3900)
MCH: 28 pg (ref 27.0–33.0)
MCHC: 32.4 g/dL (ref 32.0–36.0)
MCV: 86.4 fL (ref 80.0–100.0)
MPV: 11.6 fL (ref 7.5–12.5)
Monocytes Relative: 5.5 %
Neutro Abs: 8496 cells/uL — ABNORMAL HIGH (ref 1500–7800)
Neutrophils Relative %: 70.8 %
Platelets: 261 10*3/uL (ref 140–400)
RBC: 4.72 10*6/uL (ref 3.80–5.10)
RDW: 13.9 % (ref 11.0–15.0)
Total Lymphocyte: 22.1 %
WBC: 12 10*3/uL — ABNORMAL HIGH (ref 3.8–10.8)

## 2019-11-20 LAB — MAGNESIUM: Magnesium: 2.1 mg/dL (ref 1.5–2.5)

## 2019-12-03 ENCOUNTER — Other Ambulatory Visit: Payer: Self-pay

## 2019-12-03 ENCOUNTER — Encounter: Payer: Self-pay | Admitting: Family Medicine

## 2019-12-03 ENCOUNTER — Ambulatory Visit (INDEPENDENT_AMBULATORY_CARE_PROVIDER_SITE_OTHER): Payer: Medicaid Other | Admitting: Family Medicine

## 2019-12-03 DIAGNOSIS — I10 Essential (primary) hypertension: Secondary | ICD-10-CM

## 2019-12-03 NOTE — Progress Notes (Signed)
Virtual Visit via Telephone Note  I connected with Makayla Lin on 12/03/19 at 11:37am by telephone and verified that I am speaking with the correct person using two identifiers.      Pt location: at home   Physician location:  In office, [facility], Vic Blackbird MD     On call: patient and physician   I discussed the limitations, risks, security and privacy concerns of performing an evaluation and management service by telephone and the availability of in person appointments. I also discussed with the patient that there may be a patient responsible charge related to this service. The patient expressed understanding and agreed to proceed.    History of Present Illness: Bp at our last visit was elevated.  I added amlodipine 5 mg to her losartan approximately 2 weeks ago.  This is a telephone visit to follow-up her blood pressure readings. Her labs came back fairly unremarkable renal function was normal.  She has been checking her blood pressure randomly at home.  States Yesterday BP 138/90, no symptoms, she states she had a elevated one a 148/101 last week but she did not have any symptoms. She is still taking her losartan along with the amlodipine she has not had any side effects with the addition of medication no chest pain no headache no leg swelling feels fine he does admit that she needs to cut back on her salt and her pork products      Observations/Objective: Unable to visualize telephone visit no acute distress noted over the phone Assessment and Plan:  Hypertension blood pressure has improved some we will have her take her blood pressure daily continue losartan with the additional amlodipine 5 mg here we will follow-up in 4 weeks in the office she is to call me if for some reason her blood pressure is staying above 140s over 90s before that visit.  Discussed dietary changes to make decreasing sodium fast food and pork products.  Also increase her water Follow Up  Instructions:   RTC 1 month I discussed the assessment and treatment plan with the patient. The patient was provided an opportunity to ask questions and all were answered. The patient agreed with the plan and demonstrated an understanding of the instructions.   The patient was advised to call back or seek an in-person evaluation if the symptoms worsen or if the condition fails to improve as anticipated.  I provided 5 minutes of non-face-to-face time during this encounter. End time  11:42am   Vic Blackbird, MD

## 2020-03-20 ENCOUNTER — Other Ambulatory Visit: Payer: Self-pay | Admitting: Family Medicine

## 2020-04-15 ENCOUNTER — Other Ambulatory Visit: Payer: Self-pay | Admitting: Family Medicine

## 2020-04-15 DIAGNOSIS — G6289 Other specified polyneuropathies: Secondary | ICD-10-CM

## 2020-04-15 NOTE — Telephone Encounter (Signed)
Last refilled: 11/19/2019 Last office visit: 12/03/2019

## 2020-04-29 ENCOUNTER — Other Ambulatory Visit: Payer: Self-pay | Admitting: Family Medicine

## 2020-05-24 IMAGING — CT CT HEAD WITHOUT CONTRAST
4 series · 16 of 47 positions shown, 18 images · non-contrast
Comparison: CT scan of June 07, 2012.

CLINICAL DATA: Seizure.

EXAM:
CT HEAD WITHOUT CONTRAST
TECHNIQUE: Contiguous axial images were obtained from the base of the skull
through the vertex without intravenous contrast.

[Series 3: head without · axial · non-contrast · 0.40mm/px · z∈[-74,+46]mm · 7 of 34 slices shown, 9 images]
[im 5/34  brain]
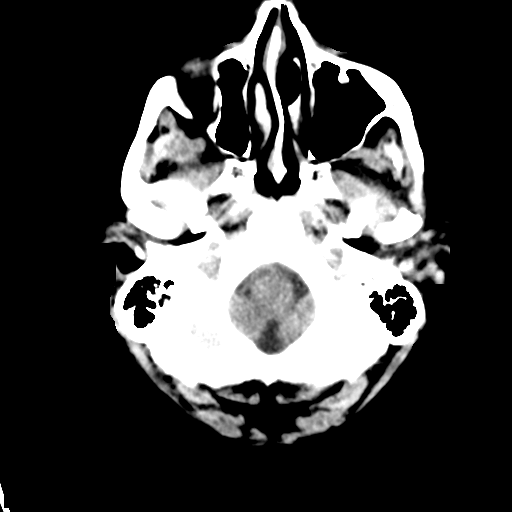
[im 5/34  bone]
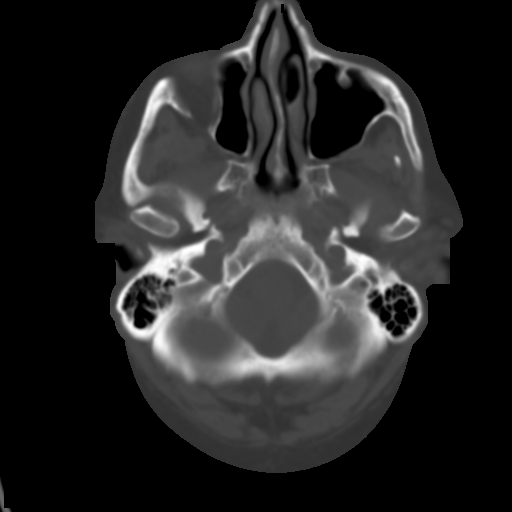
[im 9/34  brain]
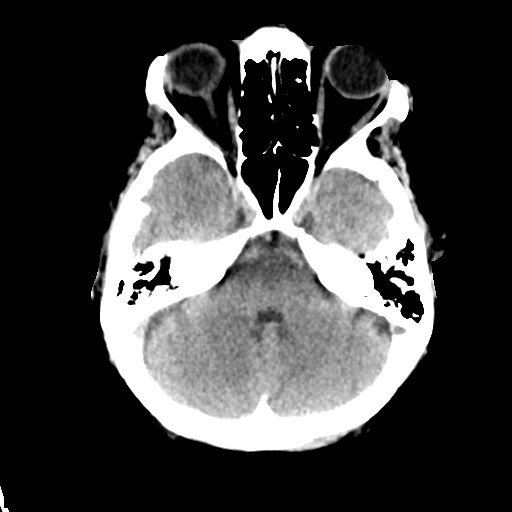
[im 13/34  brain]
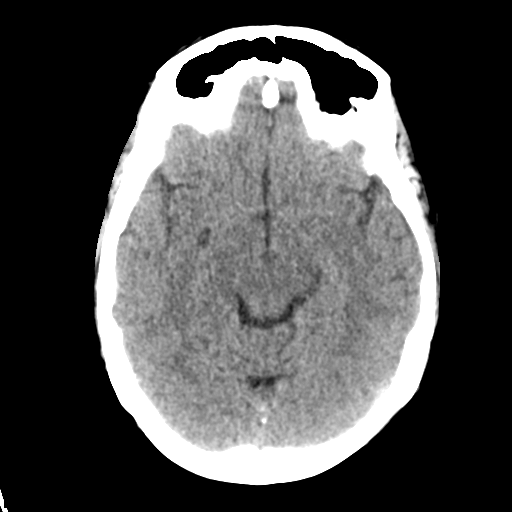
[im 17/34  brain]
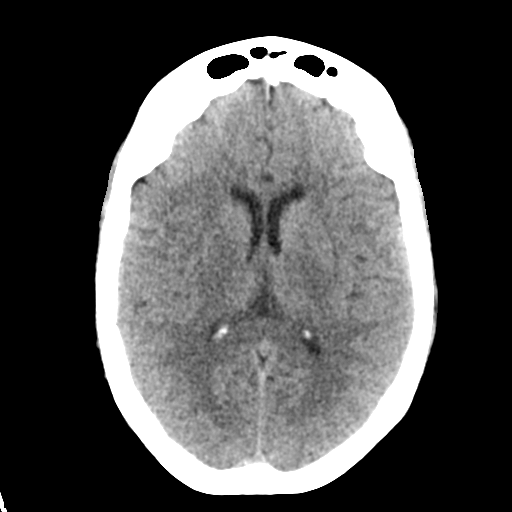
[im 21/34  brain]
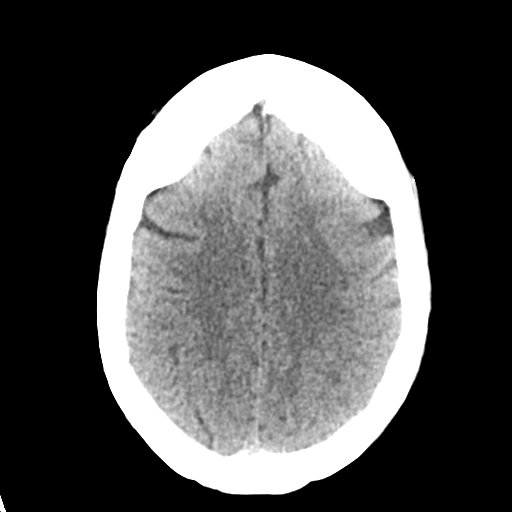
[im 21/34  bone]
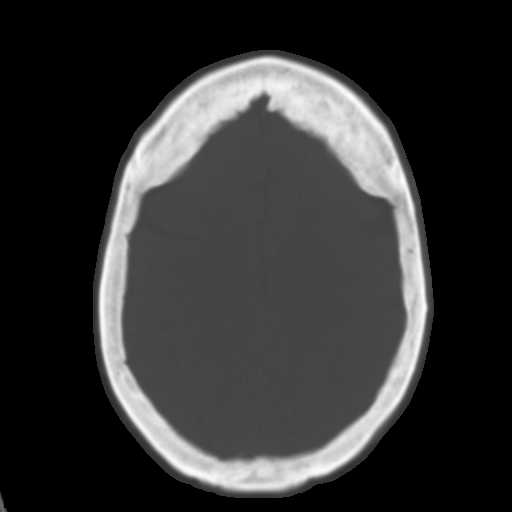
[im 25/34  brain]
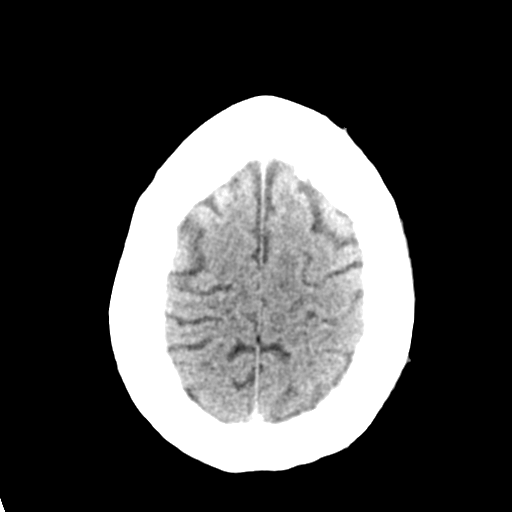
[im 29/34  brain]
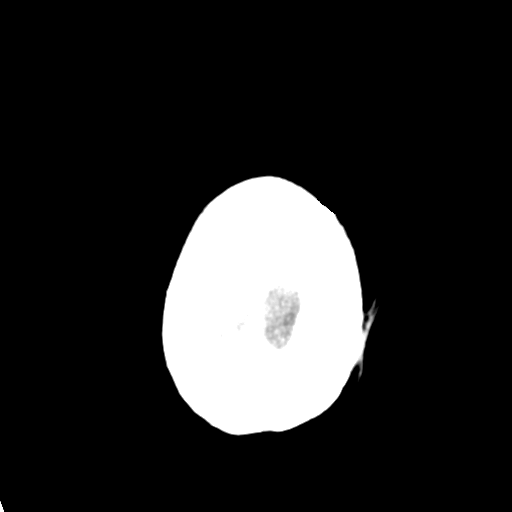

[Series 4: head bone · axial · 0.40mm/px · z∈[-78,-46]mm · 3 of 83 slices shown]
[im 9/83  bone]
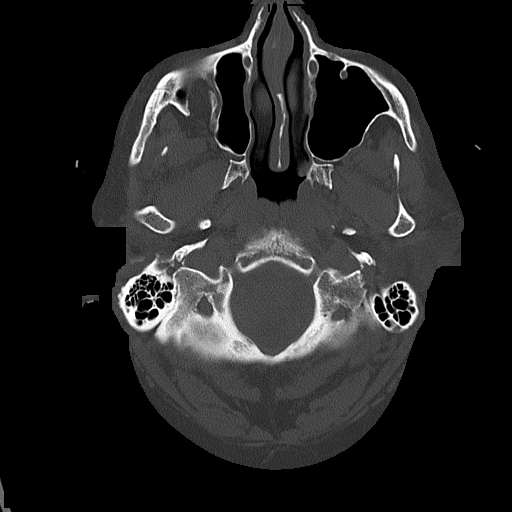
[im 17/83  bone]
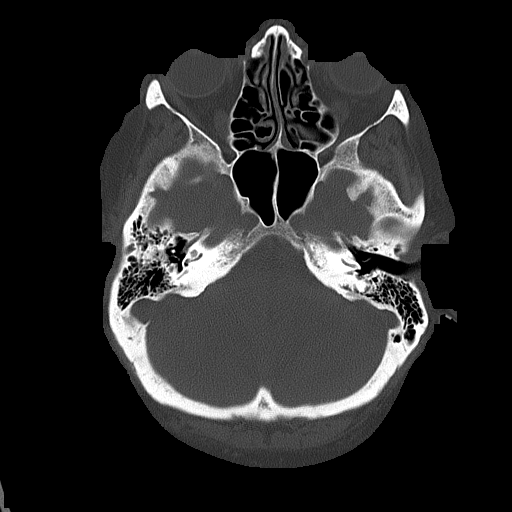
[im 25/83  bone]
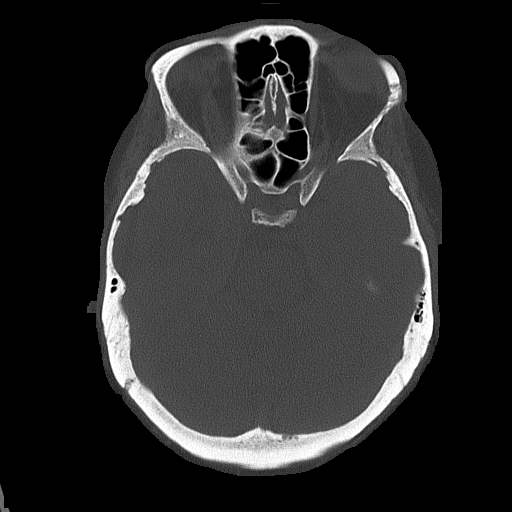

[Series 5: head without cor · coronal · non-contrast · 0.32mm/px · 3 of 67 slices shown]
[im 23/67  brain]
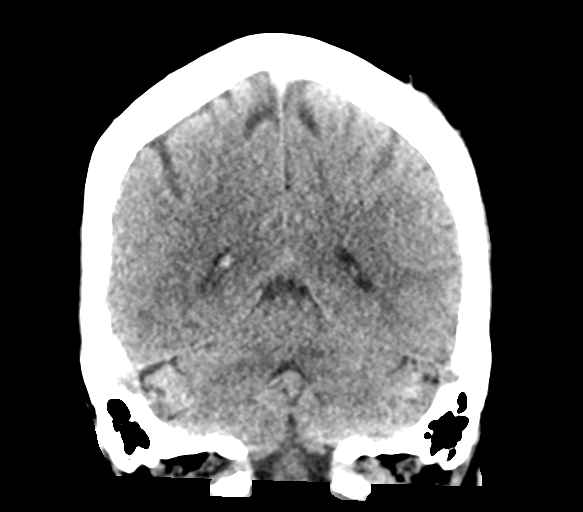
[im 30/67  brain]
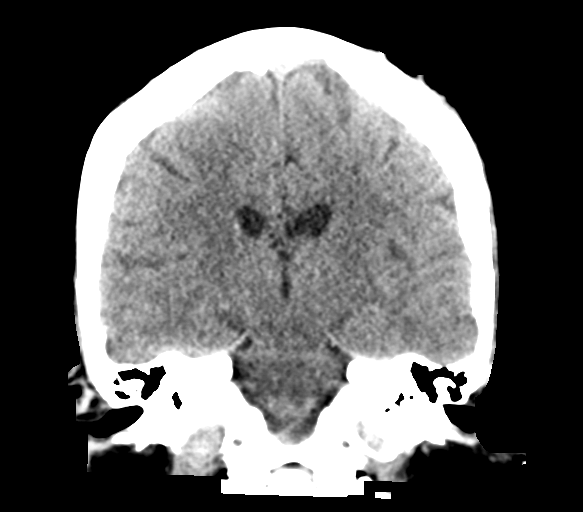
[im 37/67  brain]
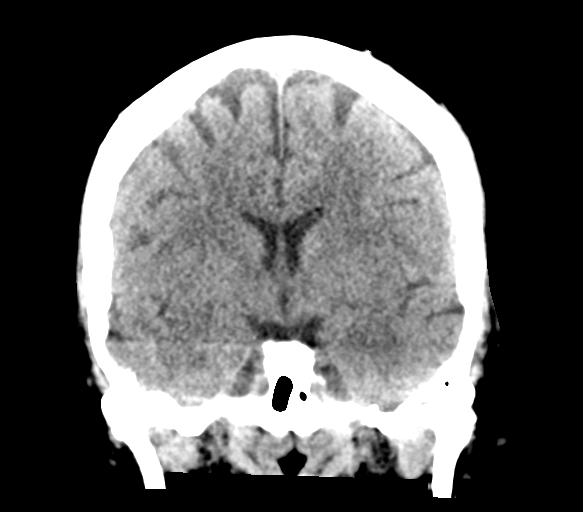

[Series 6: head without sag · sagittal · non-contrast · 0.32mm/px · 3 of 51 slices shown]
[im 17/51  brain]
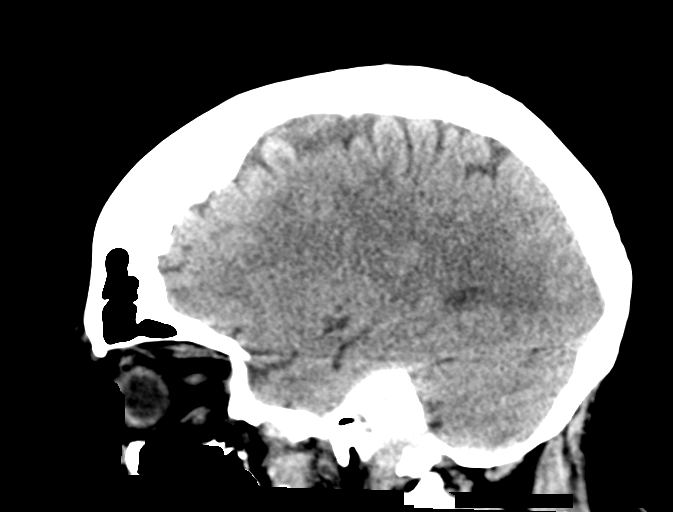
[im 26/51  brain]
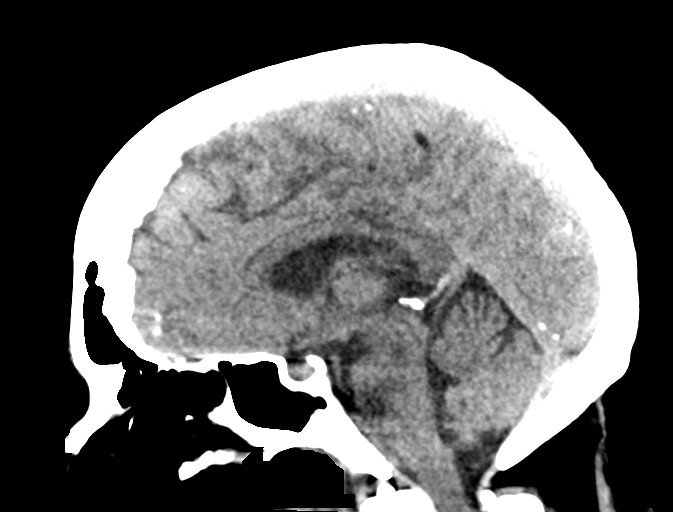
[im 34/51  brain]
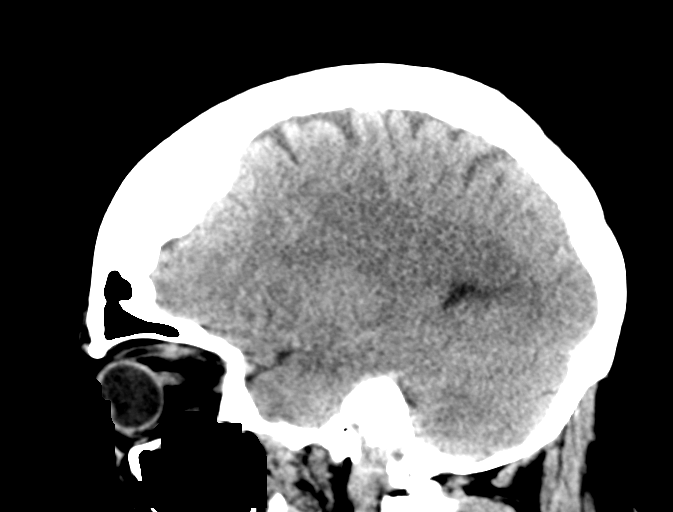

[16 of 47 positions shown; findings below may reference images not displayed]

FINDINGS: Brain: Mild chronic ischemic white matter disease is noted. No
evidence of acute infarction, hemorrhage, hydrocephalus, extra-axial
collection or mass lesion/mass effect.

Vascular: No hyperdense vessel or unexpected calcification.

Skull: Normal. Negative for fracture or focal lesion.

Sinuses/Orbits: No acute finding.

Other: None.
IMPRESSION: Mild chronic ischemic white matter disease. No acute intracranial
abnormality seen.

## 2020-06-02 ENCOUNTER — Other Ambulatory Visit: Payer: Self-pay | Admitting: Family Medicine

## 2020-07-17 ENCOUNTER — Other Ambulatory Visit: Payer: Self-pay | Admitting: Family Medicine

## 2020-07-17 DIAGNOSIS — G6289 Other specified polyneuropathies: Secondary | ICD-10-CM

## 2020-07-17 NOTE — Telephone Encounter (Signed)
Last OV 12/03/2019 Last refill 04/15/2020

## 2020-08-17 ENCOUNTER — Other Ambulatory Visit: Payer: Self-pay | Admitting: Family Medicine

## 2020-08-17 DIAGNOSIS — G6289 Other specified polyneuropathies: Secondary | ICD-10-CM

## 2020-08-18 ENCOUNTER — Other Ambulatory Visit: Payer: Self-pay | Admitting: Family Medicine

## 2020-08-18 DIAGNOSIS — G6289 Other specified polyneuropathies: Secondary | ICD-10-CM

## 2020-08-19 NOTE — Telephone Encounter (Signed)
Ok to refill??  Last office visit 12/03/2019.  Last refill 07/18/2020.

## 2020-08-19 NOTE — Telephone Encounter (Signed)
Pt needs OV 

## 2020-09-12 ENCOUNTER — Ambulatory Visit (INDEPENDENT_AMBULATORY_CARE_PROVIDER_SITE_OTHER): Payer: Medicaid Other | Admitting: Family

## 2020-09-12 ENCOUNTER — Encounter: Payer: Self-pay | Admitting: Family

## 2020-09-12 DIAGNOSIS — M76821 Posterior tibial tendinitis, right leg: Secondary | ICD-10-CM
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
## 2020-09-19 ENCOUNTER — Other Ambulatory Visit: Payer: Self-pay | Admitting: Family Medicine

## 2020-09-19 DIAGNOSIS — G6289 Other specified polyneuropathies: Secondary | ICD-10-CM

## 2020-09-19 NOTE — Telephone Encounter (Signed)
Ok to refill??  Last office visit 12/03/2019  Last refill 08/19/2020.

## 2020-09-30 ENCOUNTER — Ambulatory Visit: Payer: Medicaid Other | Admitting: Family

## 2020-10-03 ENCOUNTER — Ambulatory Visit: Payer: Medicaid Other | Admitting: Family

## 2020-10-10 ENCOUNTER — Ambulatory Visit (INDEPENDENT_AMBULATORY_CARE_PROVIDER_SITE_OTHER): Payer: Medicaid Other | Admitting: Family Medicine

## 2020-10-10 ENCOUNTER — Encounter: Payer: Self-pay | Admitting: Family Medicine

## 2020-10-10 ENCOUNTER — Other Ambulatory Visit: Payer: Self-pay

## 2020-10-10 VITALS — BP 142/82 | HR 86 | Temp 98.0°F | Resp 14 | Ht 62.0 in | Wt 199.0 lb

## 2020-10-10 DIAGNOSIS — R1013 Epigastric pain: Secondary | ICD-10-CM | POA: Diagnosis not present

## 2020-10-10 DIAGNOSIS — I10 Essential (primary) hypertension: Secondary | ICD-10-CM

## 2020-10-10 DIAGNOSIS — K2900 Acute gastritis without bleeding: Secondary | ICD-10-CM | POA: Diagnosis not present

## 2020-10-10 DIAGNOSIS — F411 Generalized anxiety disorder: Secondary | ICD-10-CM | POA: Diagnosis not present

## 2020-10-10 MED ORDER — PANTOPRAZOLE SODIUM 40 MG PO TBEC: 40.0000 mg | DELAYED_RELEASE_TABLET | Freq: Two times a day (BID) | ORAL | 3 refills | Status: DC

## 2020-10-10 MED ORDER — AMLODIPINE BESYLATE 5 MG PO TABS: 5.0000 mg | ORAL_TABLET | Freq: Every day | ORAL | 4 refills | Status: DC

## 2020-10-10 MED ORDER — HYDROXYZINE HCL 10 MG PO TABS: 10.0000 mg | ORAL_TABLET | Freq: Three times a day (TID) | ORAL | 1 refills | Status: DC | PRN

## 2020-10-10 MED ORDER — LOSARTAN POTASSIUM 100 MG PO TABS: 100.0000 mg | ORAL_TABLET | Freq: Every day | ORAL | 3 refills | Status: DC

## 2020-10-10 NOTE — Progress Notes (Signed)
Subjective:    Patient ID: Makayla Lin, female    DOB: 09/27/1956, 64 y.o.   MRN: [id number]  Patient presents for Abdominal Pain (generalized abd pain- has tried protonix) and Anxiety (medical issues with pt son)   Pt here to f/u chronic medical problems  Last visit December 2020 Meds reviewed  HTN- she is taking bp meds losartan and norvasc without any difficulty  BP at home has been good   Chronic pain- followed by pain management, on oxycodone and lyrica  GAD- history of anxiety and MDD , she has been on trazodone , she is followed by [facility]  Her son has had significant health problems, he lost his leg recently,she feels like she is at fault for not being able to help him, he is in his 5's and able bodied prior to surgery States her nerves have been bad the past few weeks, she cant calm herself, she has been having GI upset, burning in stomach, but also loose stools at time, had an episode of fecal incontinence. She noticed bowel changes only when she stook peptobismol otherwise normal, but still had the epigastric pain. She took protonix from her daughter and that stopped her pain Denies any vomiting, occ nausea No blood in stool    Review Of Systems:  GEN- denies fatigue, fever, weight loss,weakness, recent illness HEENT- denies eye drainage, change in vision, nasal discharge, CVS- denies chest pain, palpitations RESP- denies SOB, cough, wheeze ABD- denies N/V, change in stools, +abd pain GU- denies dysuria, hematuria, dribbling, incontinence MSK- denies joint pain, muscle aches, injury Neuro- denies headache, dizziness, syncope, seizure activity       Objective:    BP (!) 142/82    Pulse 86    Temp 98 F (36.7 C) (Temporal)    Resp 14    Ht 5\' 2"  (1.575 m)    Wt 199 lb (90.3 kg)    SpO2 92%    BMI 36.40 kg/m  GEN- NAD, alert and oriented x3 HEENT- PERRL, EOMI, non injected sclera, pink conjunctiva, MMM, oropharynx clear Neck- Supple, no thyromegaly CVS- RRR,  no murmur RESP-CTAB ABD-NABS,soft, mild ttp epigastric no rebound, no guarding Psych- tearful depressed affect, not anxious appearing, good eye contact, no SI  EXT- No edema Pulses- Radial, DP- 2+  GAD 7 score  17 PHQ 9 Score  13       Assessment & Plan:      Problem List Items Addressed This Visit      Unprioritized   Generalized anxiety disorder    Pt asking for anxiety med Recommend she follow up with her psychiatrist I will give her atarax prn until then      Relevant Medications   hydrOXYzine (ATARAX/VISTARIL) 10 MG tablet   Hypertension - Primary    Continue current meds Check bp at home  Pt very upset about her son today in office       Relevant Medications   amLODipine (NORVASC) 5 MG tablet   losartan (COZAAR) 100 MG tablet   Other Relevant Orders   CBC with Differential/Platelet (Completed)   Comprehensive metabolic panel (Completed)    Other Visit Diagnoses    Epigastric abdominal pain       Epigastric pain, with concern for gastritis, pain improved with PPI, will put on high dose BID protonix, check labs, if not improved recommend GI referral    Relevant Orders   Lipase (Completed)   Other acute gastritis without hemorrhage  Note: This dictation was prepared with Dragon dictation along with smaller phrase technology. Any transcriptional errors that result from this process are unintentional.

## 2020-10-10 NOTE — Patient Instructions (Signed)
Try the atarax for anxiety  Take the protonix twice a day  We will call with lab results F/U pending results

## 2020-10-11 LAB — CBC WITH DIFFERENTIAL/PLATELET
Absolute Monocytes: 576 cells/uL (ref 200–950)
Basophils Absolute: 40 cells/uL (ref 0–200)
Basophils Relative: 0.4 %
Eosinophils Absolute: 263 cells/uL (ref 15–500)
Eosinophils Relative: 2.6 %
HCT: 42.9 % (ref 35.0–45.0)
Hemoglobin: 13.6 g/dL (ref 11.7–15.5)
Lymphs Abs: 2212 cells/uL (ref 850–3900)
MCH: 27.3 pg (ref 27.0–33.0)
MCHC: 31.7 g/dL — ABNORMAL LOW (ref 32.0–36.0)
MCV: 86 fL (ref 80.0–100.0)
MPV: 12 fL (ref 7.5–12.5)
Monocytes Relative: 5.7 %
Neutro Abs: 7009 cells/uL (ref 1500–7800)
Neutrophils Relative %: 69.4 %
Platelets: 267 10*3/uL (ref 140–400)
RBC: 4.99 10*6/uL (ref 3.80–5.10)
RDW: 14.5 % (ref 11.0–15.0)
Total Lymphocyte: 21.9 %
WBC: 10.1 10*3/uL (ref 3.8–10.8)

## 2020-10-11 LAB — LIPASE: Lipase: 6 U/L — ABNORMAL LOW (ref 7–60)

## 2020-10-11 LAB — COMPREHENSIVE METABOLIC PANEL
AG Ratio: 1.2 (calc) (ref 1.0–2.5)
ALT: 15 U/L (ref 6–29)
AST: 14 U/L (ref 10–35)
Albumin: 4 g/dL (ref 3.6–5.1)
Alkaline phosphatase (APISO): 93 U/L (ref 37–153)
BUN: 15 mg/dL (ref 7–25)
CO2: 30 mmol/L (ref 20–32)
Calcium: 9.5 mg/dL (ref 8.6–10.4)
Chloride: 101 mmol/L (ref 98–110)
Creat: 0.95 mg/dL (ref 0.50–0.99)
Globulin: 3.3 g/dL (calc) (ref 1.9–3.7)
Glucose, Bld: 130 mg/dL — ABNORMAL HIGH (ref 65–99)
Potassium: 4.8 mmol/L (ref 3.5–5.3)
Sodium: 139 mmol/L (ref 135–146)
Total Bilirubin: 0.3 mg/dL (ref 0.2–1.2)
Total Protein: 7.3 g/dL (ref 6.1–8.1)

## 2020-10-12 NOTE — Assessment & Plan Note (Signed)
Pt asking for anxiety med Recommend she follow up with her psychiatrist I will give her atarax prn until then

## 2020-10-12 NOTE — Assessment & Plan Note (Signed)
Continue current meds Check bp at home  Pt very upset about her son today in office

## 2020-10-13 ENCOUNTER — Encounter: Payer: Self-pay | Admitting: *Deleted

## 2020-10-14 ENCOUNTER — Other Ambulatory Visit: Payer: Self-pay | Admitting: Family Medicine

## 2020-11-04 ENCOUNTER — Other Ambulatory Visit: Payer: Self-pay | Admitting: Family Medicine

## 2020-12-02 ENCOUNTER — Other Ambulatory Visit: Payer: Self-pay | Admitting: Family Medicine

## 2020-12-31 ENCOUNTER — Other Ambulatory Visit: Payer: Self-pay | Admitting: Family Medicine

## 2020-12-31 DIAGNOSIS — G6289 Other specified polyneuropathies: Secondary | ICD-10-CM

## 2020-12-31 NOTE — Telephone Encounter (Signed)
Ok to refill??  Last office visit 10/10/2020.  Last refill 03/19/2020.

## 2021-01-03 ENCOUNTER — Other Ambulatory Visit: Payer: Self-pay | Admitting: Family Medicine

## 2021-01-26 ENCOUNTER — Other Ambulatory Visit: Payer: Self-pay | Admitting: Family Medicine

## 2021-01-26 DIAGNOSIS — G6289 Other specified polyneuropathies: Secondary | ICD-10-CM

## 2021-01-26 NOTE — Telephone Encounter (Signed)
Ok to refill??  Last office visit 10/10/2020.  Last refill 12/31/2020.  Ok to add refills to prescription?

## 2021-01-30 ENCOUNTER — Other Ambulatory Visit: Payer: Self-pay | Admitting: Family Medicine

## 2021-02-08 ENCOUNTER — Other Ambulatory Visit: Payer: Self-pay | Admitting: Family Medicine

## 2021-02-16 ENCOUNTER — Encounter: Payer: Self-pay | Admitting: *Deleted

## 2021-03-12 ENCOUNTER — Other Ambulatory Visit: Payer: Self-pay | Admitting: Family Medicine

## 2021-03-24 ENCOUNTER — Ambulatory Visit (INDEPENDENT_AMBULATORY_CARE_PROVIDER_SITE_OTHER): Payer: Medicaid Other | Admitting: Nurse Practitioner

## 2021-03-24 ENCOUNTER — Encounter: Payer: Self-pay | Admitting: Nurse Practitioner

## 2021-03-24 ENCOUNTER — Other Ambulatory Visit: Payer: Self-pay

## 2021-03-24 VITALS — BP 144/82 | HR 79 | Temp 97.2°F | Ht 62.0 in | Wt 212.0 lb

## 2021-03-24 DIAGNOSIS — G8929 Other chronic pain: Secondary | ICD-10-CM

## 2021-03-24 DIAGNOSIS — F411 Generalized anxiety disorder: Secondary | ICD-10-CM | POA: Diagnosis not present

## 2021-03-24 DIAGNOSIS — R5383 Other fatigue: Secondary | ICD-10-CM

## 2021-03-24 DIAGNOSIS — F331 Major depressive disorder, recurrent, moderate: Secondary | ICD-10-CM | POA: Diagnosis not present

## 2021-03-24 DIAGNOSIS — I10 Essential (primary) hypertension: Secondary | ICD-10-CM | POA: Diagnosis not present

## 2021-03-24 DIAGNOSIS — Z8619 Personal history of other infectious and parasitic diseases: Secondary | ICD-10-CM

## 2021-03-24 DIAGNOSIS — Z136 Encounter for screening for cardiovascular disorders: Secondary | ICD-10-CM

## 2021-03-24 DIAGNOSIS — K219 Gastro-esophageal reflux disease without esophagitis: Secondary | ICD-10-CM

## 2021-03-24 DIAGNOSIS — Z1231 Encounter for screening mammogram for malignant neoplasm of breast: Secondary | ICD-10-CM

## 2021-03-24 DIAGNOSIS — K2900 Acute gastritis without bleeding: Secondary | ICD-10-CM

## 2021-03-24 DIAGNOSIS — M545 Low back pain, unspecified: Secondary | ICD-10-CM | POA: Diagnosis not present

## 2021-03-24 DIAGNOSIS — Z13220 Encounter for screening for lipoid disorders: Secondary | ICD-10-CM

## 2021-03-24 DIAGNOSIS — Z860100 Personal history of colon polyps, unspecified: Secondary | ICD-10-CM | POA: Insufficient documentation

## 2021-03-24 DIAGNOSIS — B182 Chronic viral hepatitis C: Secondary | ICD-10-CM
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
## 2021-03-24 NOTE — Patient Instructions (Signed)
1 month f/u.

## 2021-03-25 DIAGNOSIS — K219 Gastro-esophageal reflux disease without esophagitis: Secondary | ICD-10-CM | POA: Insufficient documentation

## 2021-03-25 LAB — COMPLETE METABOLIC PANEL WITH GFR
AG Ratio: 1.3 (calc) (ref 1.0–2.5)
ALT: 12 U/L (ref 6–29)
AST: 12 U/L (ref 10–35)
Albumin: 4 g/dL (ref 3.6–5.1)
Alkaline phosphatase (APISO): 89 U/L (ref 37–153)
BUN: 16 mg/dL (ref 7–25)
CO2: 29 mmol/L (ref 20–32)
Calcium: 9.2 mg/dL (ref 8.6–10.4)
Chloride: 105 mmol/L (ref 98–110)
Creat: 0.85 mg/dL (ref 0.50–0.99)
GFR, Est African American: 85 mL/min/{1.73_m2} (ref >=60)
GFR, Est Non African American: 73 mL/min/{1.73_m2} (ref >=60)
Globulin: 3 g/dL (calc) (ref 1.9–3.7)
Glucose, Bld: 104 mg/dL — ABNORMAL HIGH (ref 65–99)
Potassium: 4.9 mmol/L (ref 3.5–5.3)
Sodium: 143 mmol/L (ref 135–146)
Total Bilirubin: 0.2 mg/dL (ref 0.2–1.2)
Total Protein: 7 g/dL (ref 6.1–8.1)

## 2021-03-25 LAB — LIPID PANEL
Cholesterol: 121 mg/dL (ref <200)
HDL: 48 mg/dL — ABNORMAL LOW (ref >=50)
LDL Cholesterol (Calc): 53 mg/dL (calc)
Non-HDL Cholesterol (Calc): 73 mg/dL (calc) (ref <130)
Total CHOL/HDL Ratio: 2.5 (calc) (ref <5.0)
Triglycerides: 112 mg/dL (ref <150)

## 2021-03-25 LAB — HEMOGLOBIN A1C
Hgb A1c MFr Bld: 6.2 % of total Hgb — ABNORMAL HIGH (ref <5.7)
Mean Plasma Glucose: 131 mg/dL
eAG (mmol/L): 7.3 mmol/L

## 2021-03-25 LAB — MAGNESIUM: Magnesium: 2.2 mg/dL (ref 1.5–2.5)

## 2021-03-25 LAB — TSH: TSH: 3.36 mIU/L (ref 0.40–4.50)

## 2021-03-25 MED ORDER — CITALOPRAM HYDROBROMIDE 10 MG PO TABS: 10.0000 mg | ORAL_TABLET | Freq: Every day | ORAL | 1 refills | Status: DC

## 2021-03-25 NOTE — Assessment & Plan Note (Signed)
Chronic.  Well-controlled with Protonix daily.  We will continue this medication for now and check magnesium.  May consider trial of discontinuance in future to see if symptoms recur.  Follow-up in 6 months.

## 2021-03-25 NOTE — Assessment & Plan Note (Signed)
Chronic.  PHQ-9 and GAD-7 elevated today.  No SI/HI.  Without benefit from hydroxyzine.  Not comfortable prescribing benzodiazepines as patient is on Lyrica and oxycodone for pain management.  Discussed daily medication like SSRIs-she is agreeable to this today.  Will start on daily Celexa and follow-up in 4 weeks.  Patient educated on side effects and maximal effectiveness may not occur for up to 6 weeks.

## 2021-03-25 NOTE — Assessment & Plan Note (Signed)
Appears patient has been cured from hepatitis C.  Viral load in 2020 was nondetectable.

## 2021-03-25 NOTE — Assessment & Plan Note (Signed)
Chronic.  Follows with pain doctor-continue this collaboration.  Discussed at length potential of polypharmacy contributing to fatigue.  Patient educated extensively to not take Lyrica at the same time as oxycodone and to consider discussion with pain doctor regarding amount of pain medication she is on.  Follow-up in 3 months.

## 2021-03-25 NOTE — Assessment & Plan Note (Addendum)
Chronic.  Blood pressure slightly elevated in clinic today, although patient is anxious.  Patient also has a history of sleep apnea but has not not been wearing her CPAP consistently.  Reports she no longer has a CPAP.  Encourage patient to check blood pressure at home and notify clinic if consistently greater than 130/80.  We will treat anxiety-that assessment and plan.  We will also place referral to pulmonology for sleep study and discussion regarding CPAP.  Follow-up in 3 months.

## 2021-04-01 ENCOUNTER — Telehealth: Payer: Self-pay | Admitting: Nurse Practitioner

## 2021-04-01 ENCOUNTER — Other Ambulatory Visit: Payer: Self-pay | Admitting: Nurse Practitioner

## 2021-04-01 DIAGNOSIS — G6289 Other specified polyneuropathies: Secondary | ICD-10-CM

## 2021-04-01 MED ORDER — PREGABALIN 100 MG PO CAPS
100.0000 mg | ORAL_CAPSULE | Freq: Three times a day (TID) | ORAL | 3 refills | Status: DC
Start: 2021-04-01 — End: 2021-04-03

## 2021-04-01 NOTE — Telephone Encounter (Signed)
Prescription sent in in my name for insurance

## 2021-04-01 NOTE — Telephone Encounter (Signed)
Received call from Luther at pharmacy; refill request declined by insurance for  pregabalin (LYRICA) 100 MG capsule [[id number]]   Pharmacy:   [facility] ([place]), [place] - [address]  [address] [facility] [place] [address]  Phone:  [phone] Fax:  [phone]   Please advise at [phone]

## 2021-04-03 ENCOUNTER — Other Ambulatory Visit: Payer: Self-pay | Admitting: Nurse Practitioner

## 2021-04-03 ENCOUNTER — Other Ambulatory Visit: Payer: Self-pay

## 2021-04-03 DIAGNOSIS — G6289 Other specified polyneuropathies: Secondary | ICD-10-CM

## 2021-04-03 MED ORDER — PREGABALIN 100 MG PO CAPS: 100.0000 mg | ORAL_CAPSULE | Freq: Three times a day (TID) | ORAL | 3 refills | Status: DC

## 2021-04-03 NOTE — Progress Notes (Signed)
Refill sent in

## 2021-04-13 ENCOUNTER — Other Ambulatory Visit: Payer: Self-pay

## 2021-04-13 MED ORDER — AMLODIPINE BESYLATE 5 MG PO TABS: 1.0000 | ORAL_TABLET | Freq: Every day | ORAL | 0 refills | Status: DC

## 2021-04-15 ENCOUNTER — Encounter: Payer: Self-pay | Admitting: Nurse Practitioner

## 2021-04-22 ENCOUNTER — Encounter: Payer: Self-pay | Admitting: Nurse Practitioner

## 2021-04-22 ENCOUNTER — Other Ambulatory Visit: Payer: Self-pay

## 2021-04-22 ENCOUNTER — Ambulatory Visit (INDEPENDENT_AMBULATORY_CARE_PROVIDER_SITE_OTHER): Payer: Medicaid Other | Admitting: Nurse Practitioner

## 2021-04-22 VITALS — BP 162/98 | HR 89 | Temp 97.9°F | Ht 62.0 in | Wt 215.8 lb

## 2021-04-22 DIAGNOSIS — F411 Generalized anxiety disorder: Secondary | ICD-10-CM | POA: Diagnosis not present

## 2021-04-22 DIAGNOSIS — G629 Polyneuropathy, unspecified: Secondary | ICD-10-CM

## 2021-04-22 DIAGNOSIS — F331 Major depressive disorder, recurrent, moderate: Secondary | ICD-10-CM

## 2021-04-22 DIAGNOSIS — R7303 Prediabetes: Secondary | ICD-10-CM | POA: Insufficient documentation

## 2021-04-22 DIAGNOSIS — I10 Essential (primary) hypertension: Secondary | ICD-10-CM | POA: Diagnosis not present

## 2021-04-22 DIAGNOSIS — R600 Localized edema: Secondary | ICD-10-CM

## 2021-04-22 MED ORDER — CITALOPRAM HYDROBROMIDE 20 MG PO TABS
20.0000 mg | ORAL_TABLET | Freq: Every day | ORAL | 1 refills | Status: DC
Start: 2021-04-22 — End: 2021-05-29

## 2021-04-22 NOTE — Patient Instructions (Signed)

## 2021-04-22 NOTE — Assessment & Plan Note (Addendum)
Chronic.  Controlled with Lyrica 100 mg 3 times daily.  Patient does not take this medication at the same time as her oxycodone.  We will continue this medication for now.  We will need to fill out controlled substance contract at next visit.  Follow-up in 3 months.

## 2021-04-22 NOTE — Assessment & Plan Note (Signed)
Chronic.  Encouraged patient to wear compression stockings during the day and significantly reduce salt in diet.  Dash diet handout given.  Return to clinic if symptoms do not improve with the discussed treatment.

## 2021-04-22 NOTE — Assessment & Plan Note (Addendum)
Chronic.  PHQ-9 improved, however GAD-7 stable today.  No SI/HI.  Will increase Celexa to 20 mg daily.  Follows with psychiatry at [facility]-Next follow-up is in a couple of weeks per patient.  We will request these records and I encouraged patient to talk with psychiatrist about citalopram versus other potentially good options for her.  Follow-up in 1 month. 
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
## 2021-04-22 NOTE — Assessment & Plan Note (Signed)
Chronic.  Blood pressure elevated today in clinic, however patient has not taken blood pressure medicine yet today.  Reports she checks her blood pressure in the evening and takes her blood pressure medication the morning.  Encouraged patient to check blood pressure about 1 hour after taking medication in the morning and if consistently greater than 130/80, will increase amlodipine to 10 mg daily.  Also strongly encourage patient to significantly reduce sodium in diet and wear compression stockings.  Follow-up in 4 weeks.

## 2021-04-22 NOTE — Assessment & Plan Note (Signed)
Chronic.  PHQ-9 improved, however GAD-7 stable today.  No SI/HI.  Will increase Celexa to 20 mg daily.  Follows with psychiatry at [facility] follow-up is in a couple of weeks per patient.  We will request these records and I encouraged patient to talk with psychiatrist about citalopram versus other potentially good options for her.  Follow-up in 1 month.

## 2021-05-01 ENCOUNTER — Other Ambulatory Visit: Payer: Self-pay

## 2021-05-01 ENCOUNTER — Ambulatory Visit: Payer: Medicaid Other | Admitting: Nurse Practitioner

## 2021-05-01 ENCOUNTER — Encounter: Payer: Self-pay | Admitting: Nurse Practitioner

## 2021-05-01 VITALS — BP 122/84 | HR 78 | Temp 98.8°F | Ht 62.0 in | Wt 211.4 lb

## 2021-05-01 DIAGNOSIS — R5383 Other fatigue: Secondary | ICD-10-CM | POA: Diagnosis not present

## 2021-05-01 DIAGNOSIS — Z79899 Other long term (current) drug therapy: Secondary | ICD-10-CM | POA: Insufficient documentation

## 2021-05-01 LAB — CBC WITH DIFFERENTIAL/PLATELET
Absolute Monocytes: 545 cells/uL (ref 200–950)
Basophils Absolute: 65 cells/uL (ref 0–200)
Basophils Relative: 0.6 %
Eosinophils Absolute: 174 cells/uL (ref 15–500)
Eosinophils Relative: 1.6 %
HCT: 43.7 % (ref 35.0–45.0)
Hemoglobin: 13.7 g/dL (ref 11.7–15.5)
Lymphs Abs: 2093 cells/uL (ref 850–3900)
MCH: 26 pg — ABNORMAL LOW (ref 27.0–33.0)
MCHC: 31.4 g/dL — ABNORMAL LOW (ref 32.0–36.0)
MCV: 82.9 fL (ref 80.0–100.0)
MPV: 11.3 fL (ref 7.5–12.5)
Monocytes Relative: 5 %
Neutro Abs: 8022 cells/uL — ABNORMAL HIGH (ref 1500–7800)
Neutrophils Relative %: 73.6 %
Platelets: 231 10*3/uL (ref 140–400)
RBC: 5.27 10*6/uL — ABNORMAL HIGH (ref 3.80–5.10)
RDW: 14.8 % (ref 11.0–15.0)
Total Lymphocyte: 19.2 %
WBC: 10.9 10*3/uL — ABNORMAL HIGH (ref 3.8–10.8)

## 2021-05-01 LAB — VITAMIN D 25 HYDROXY (VIT D DEFICIENCY, FRACTURES): Vit D, 25-Hydroxy: 35 ng/mL (ref 30–100)
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
## 2021-05-01 NOTE — Patient Instructions (Signed)
[facility] schedule [facility]: [phone]  Sleep Apnea Sleep apnea affects breathing during sleep. It causes breathing to stop for a short time or to become shallow. It can also increase the risk of:  Heart attack.  Stroke.  Being very overweight (obese).  Diabetes.  Heart failure.  Irregular heartbeat. The goal of treatment is to help you breathe normally again. What are the causes? There are three kinds of sleep apnea:  Obstructive sleep apnea. This is caused by a blocked or collapsed airway.  Central sleep apnea. This happens when the brain does not send the right signals to the muscles that control breathing.  Mixed sleep apnea. This is a combination of obstructive and central sleep apnea. The most common cause of this condition is a collapsed or blocked airway. This can happen if:  Your throat muscles are too relaxed.  Your tongue and tonsils are too large.  You are overweight.  Your airway is too small.   What increases the risk?  Being overweight.  Smoking.  Having a small airway.  Being older.  Being female.  Drinking alcohol.  Taking medicines to calm yourself (sedatives or tranquilizers).  Having family members with the condition. What are the signs or symptoms?  Trouble staying asleep.  Being sleepy or tired during the day.  Getting angry a lot.  Loud snoring.  Headaches in the morning.  Not being able to focus your mind (concentrate).  Forgetting things.  Less interest in sex.  Mood swings.  Personality changes.  Feelings of sadness (depression).  Waking up a lot during the night to pee (urinate).  Dry mouth.  Sore throat. How is this diagnosed?  Your medical history.  A physical exam.  A test that is done when you are sleeping (sleep study). The test is most often done in a sleep lab but may also be done at home. How is this treated?  Sleeping on your side.  Using a medicine to get rid of mucus in  your nose (decongestant).  Avoiding the use of alcohol, medicines to help you relax, or certain pain medicines (narcotics).  Losing weight, if needed.  Changing your diet.  Not smoking.  Using a machine to open your airway while you sleep, such as: ? An oral appliance. This is a mouthpiece that shifts your lower jaw forward. ? A CPAP device. This device blows air through a mask when you breathe out (exhale). ? An EPAP device. This has valves that you put in each nostril. ? A BPAP device. This device blows air through a mask when you breathe in (inhale) and breathe out.  Having surgery if other treatments do not work. It is important to get treatment for sleep apnea. Without treatment, it can lead to:  High blood pressure.  Coronary artery disease.  In men, not being able to have an erection (impotence).  Reduced thinking ability.   Follow these instructions at home: Lifestyle  Make changes that your doctor recommends.  Eat a healthy diet.  Lose weight if needed.  Avoid alcohol, medicines to help you relax, and some pain medicines.  Do not use any products that contain nicotine or tobacco, such as cigarettes, e-cigarettes, and chewing tobacco. If you need help quitting, ask your doctor. General instructions  Take over-the-counter and prescription medicines only as told by your doctor.  If you were given a machine to use while you sleep, use it only as told by your doctor.  If you are having  surgery, make sure to tell your doctor you have sleep apnea. You may need to bring your device with you.  Keep all follow-up visits as told by your doctor. This is important. Contact a doctor if:  The machine that you were given to use during sleep bothers you or does not seem to be working.  You do not get better.  You get worse. Get help right away if:  Your chest hurts.  You have trouble breathing in enough air.  You have an uncomfortable feeling in your back, arms, or  stomach.  You have trouble talking.  One side of your body feels weak.  A part of your face is hanging down. These symptoms may be an emergency. Do not wait to see if the symptoms will go away. Get medical help right away. Call your local emergency services (911 in the [place] Do not drive yourself to the hospital. Summary  This condition affects breathing during sleep.  The most common cause is a collapsed or blocked airway.  The goal of treatment is to help you breathe normally while you sleep. This information is not intended to replace advice given to you by your health care provider. Make sure you discuss any questions you have with your health care provider. Document Revised: 09/29/2018 Document Reviewed: 08/08/2018 Elsevier Patient Education  [place].

## 2021-05-01 NOTE — Assessment & Plan Note (Signed)
For Lyrica 100 mg bid.  Refills not needed at this time.  Follow up as scheduled.

## 2021-05-05 ENCOUNTER — Encounter: Payer: Self-pay | Admitting: Nurse Practitioner

## 2021-05-06 ENCOUNTER — Telehealth: Payer: Self-pay | Admitting: *Deleted

## 2021-05-06 NOTE — Telephone Encounter (Signed)
Patient returned call and reports that BP noted at 160/99. States that she did take 4 Aleve for HA.

## 2021-05-06 NOTE — Telephone Encounter (Signed)
Discussed with provider and was instructed to advise patient to go to ER for evaluation.   Patient made aware and verbalized understanding.   Patient spouse on call and verbalized understanding as well.

## 2021-05-06 NOTE — Telephone Encounter (Signed)
Received call from patient.   Reports that BP has been elevated all day. States that it has been 160's/100's, with lowest reading at 160/102 at rest.   Reports that she has been having HA in the top of her head, and dizziness.   States that she did take Losartan 100mg  and Amlodipine 5mg  this AM. Reports that she took additional Losartan 100mg  at noon. States that she has not noticed any changes in her Sx or BP readings.   Advised that she will need to be evaluated for concern of HTN crisis. Advised to go to ER for evaluation.   States that she is going to take an additional Amlodipine and call back in 84minutes. If BP has not decreased or Sx have not improved, she will go to ER at that time.

## 2021-05-07 NOTE — Telephone Encounter (Signed)
Agree with care advice.  Will likely need to restart on HCTZ.

## 2021-05-07 NOTE — Telephone Encounter (Signed)
Please call patient.  Her BP was elevated last evening and we went to ER.  I don't see ER notes - is BP improved? If not and still symptomatic with headache, please schedule f/u with me

## 2021-05-07 NOTE — Telephone Encounter (Signed)
Left message for call bk 

## 2021-05-08 ENCOUNTER — Encounter: Payer: Self-pay | Admitting: Nurse Practitioner

## 2021-05-08 NOTE — Telephone Encounter (Signed)
Me either - I asked Makayla Lin to call her yesterday and I think she left a message and never heard back.  It looks like her BP are improving .   Please see how she is taking her medicine and be sure she is checking her BP about 1 hour after her medicine.  Also, has her headache gotten better?

## 2021-05-13 ENCOUNTER — Other Ambulatory Visit: Payer: Self-pay | Admitting: Family Medicine

## 2021-05-15 ENCOUNTER — Encounter: Payer: Self-pay | Admitting: Nurse Practitioner

## 2021-05-15 DIAGNOSIS — I10 Essential (primary) hypertension: Secondary | ICD-10-CM

## 2021-05-15 MED ORDER — HYDROCHLOROTHIAZIDE 12.5 MG PO CAPS: 12.5000 mg | ORAL_CAPSULE | Freq: Every day | ORAL | 1 refills | Status: DC

## 2021-05-22 ENCOUNTER — Other Ambulatory Visit: Payer: Self-pay

## 2021-05-29 ENCOUNTER — Ambulatory Visit
Admission: RE | Admit: 2021-05-29 | Discharge: 2021-05-29 | Disposition: A | Discharge status: Home / Self Care | Payer: Medicaid Other | Source: Ambulatory Visit | Attending: Nurse Practitioner | Admitting: Nurse Practitioner

## 2021-05-29 ENCOUNTER — Ambulatory Visit (INDEPENDENT_AMBULATORY_CARE_PROVIDER_SITE_OTHER): Payer: Medicaid Other | Admitting: Nurse Practitioner

## 2021-05-29 ENCOUNTER — Other Ambulatory Visit: Payer: Self-pay

## 2021-05-29 ENCOUNTER — Encounter: Payer: Self-pay | Admitting: Nurse Practitioner

## 2021-05-29 VITALS — BP 122/78 | HR 81 | Temp 97.7°F | Ht 62.0 in | Wt 211.0 lb

## 2021-05-29 DIAGNOSIS — M546 Pain in thoracic spine: Secondary | ICD-10-CM | POA: Diagnosis not present

## 2021-05-29 DIAGNOSIS — F411 Generalized anxiety disorder: Secondary | ICD-10-CM | POA: Diagnosis not present

## 2021-05-29 DIAGNOSIS — F331 Major depressive disorder, recurrent, moderate: Secondary | ICD-10-CM | POA: Diagnosis not present

## 2021-05-29 DIAGNOSIS — G8929 Other chronic pain: Secondary | ICD-10-CM

## 2021-05-29 DIAGNOSIS — D72828 Other elevated white blood cell count: Secondary | ICD-10-CM

## 2021-05-29 DIAGNOSIS — I10 Essential (primary) hypertension: Secondary | ICD-10-CM | POA: Diagnosis not present

## 2021-05-29 MED ORDER — CITALOPRAM HYDROBROMIDE 20 MG PO TABS: 20.0000 mg | ORAL_TABLET | Freq: Every day | ORAL | 1 refills | Status: DC

## 2021-05-29 MED ORDER — AMLODIPINE BESYLATE 5 MG PO TABS: 1.0000 | ORAL_TABLET | Freq: Every day | ORAL | 1 refills | Status: DC

## 2021-05-29 MED ORDER — HYDROCHLOROTHIAZIDE 12.5 MG PO CAPS: 12.5000 mg | ORAL_CAPSULE | Freq: Every day | ORAL | 1 refills | Status: DC

## 2021-05-29 NOTE — Assessment & Plan Note (Signed)
Chronic.  Blood pressure at goal today in office and reportedly at goal at home as well.  Plan to continue current medications for now including losartan 100 mg, amlodipine 5 mg, and hydrochlorothiazide 12.5 mg.  We will check kidney function with electrolytes today and blood counts.  Follow-up in 3 months.

## 2021-05-29 NOTE — Assessment & Plan Note (Addendum)
Chronic.  Follows with pain management in [place].  Records requested today.  Patient requests referral to back doctor today-referral placed to neurosurgery.  She is interested in weaning off of her narcotic medication and treating the underlying cause of her chronic back pain.  Hopefully, neurosurgeon can discuss potential options.  We will continue twice daily dosing of Lyrica 100 mg.  We will also obtain a thoracic x-ray of her back today.  Follow-up in 2 months.
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
## 2021-05-29 NOTE — Assessment & Plan Note (Signed)
Chronic.  PHQ-9 and GAD-7 both 0 today.  We will continue Celexa 20 mg daily-refills given for 6 months.  Continue collaboration with psychiatry at [facility] requested today.  Follow-up in 2 months.

## 2021-05-29 NOTE — Assessment & Plan Note (Signed)
Chronic.  PHQ-9 and GAD-7 both 0 today.  We will continue Celexa 20 mg daily-refills given for 6 months.  Continue collaboration with psychiatry at [facility] requested today.  Follow-up in 2 months.

## 2021-05-30 LAB — BASIC METABOLIC PANEL WITH GFR
BUN: 18 mg/dL (ref 7–25)
CO2: 34 mmol/L — ABNORMAL HIGH (ref 20–32)
Calcium: 9.6 mg/dL (ref 8.6–10.4)
Chloride: 98 mmol/L (ref 98–110)
Creat: 0.94 mg/dL (ref 0.50–0.99)
GFR, Est African American: 75 mL/min/{1.73_m2} (ref >=60)
GFR, Est Non African American: 65 mL/min/{1.73_m2} (ref >=60)
Glucose, Bld: 110 mg/dL — ABNORMAL HIGH (ref 65–99)
Potassium: 4.6 mmol/L (ref 3.5–5.3)
Sodium: 140 mmol/L (ref 135–146)

## 2021-05-30 LAB — CBC WITH DIFFERENTIAL/PLATELET
Absolute Monocytes: 719 cells/uL (ref 200–950)
Basophils Absolute: 58 cells/uL (ref 0–200)
Basophils Relative: 0.5 %
Eosinophils Absolute: 174 cells/uL (ref 15–500)
Eosinophils Relative: 1.5 %
HCT: 46.3 % — ABNORMAL HIGH (ref 35.0–45.0)
Hemoglobin: 14.4 g/dL (ref 11.7–15.5)
Lymphs Abs: 2216 cells/uL (ref 850–3900)
MCH: 26 pg — ABNORMAL LOW (ref 27.0–33.0)
MCHC: 31.1 g/dL — ABNORMAL LOW (ref 32.0–36.0)
MCV: 83.7 fL (ref 80.0–100.0)
MPV: 11.6 fL (ref 7.5–12.5)
Monocytes Relative: 6.2 %
Neutro Abs: 8433 cells/uL — ABNORMAL HIGH (ref 1500–7800)
Neutrophils Relative %: 72.7 %
Platelets: 248 10*3/uL (ref 140–400)
RBC: 5.53 10*6/uL — ABNORMAL HIGH (ref 3.80–5.10)
RDW: 15.2 % — ABNORMAL HIGH (ref 11.0–15.0)
Total Lymphocyte: 19.1 %
WBC: 11.6 10*3/uL — ABNORMAL HIGH (ref 3.8–10.8)

## 2021-06-02 NOTE — Progress Notes (Signed)
[id number]

## 2021-06-02 NOTE — Addendum Note (Signed)
Addended by: Noemi Chapel A on: 06/02/2021 10:11 AM   Modules accepted: Orders

## 2021-06-04 ENCOUNTER — Other Ambulatory Visit: Payer: Self-pay | Admitting: Nurse Practitioner

## 2021-06-04 MED ORDER — HYDROXYZINE PAMOATE 50 MG PO CAPS: 50.0000 mg | ORAL_CAPSULE | Freq: Every day | ORAL | 0 refills | Status: DC

## 2021-07-06 ENCOUNTER — Institutional Professional Consult (permissible substitution): Payer: Medicaid Other | Admitting: Internal Medicine

## 2021-07-25 ENCOUNTER — Ambulatory Visit
Admission: RE | Admit: 2021-07-25 | Discharge: 2021-07-25 | Disposition: A | Discharge status: Home / Self Care | Payer: Medicaid Other | Source: Ambulatory Visit | Attending: Nurse Practitioner | Admitting: Nurse Practitioner

## 2021-07-25 DIAGNOSIS — Z1231 Encounter for screening mammogram for malignant neoplasm of breast: Secondary | ICD-10-CM

## 2021-07-29 ENCOUNTER — Encounter: Payer: Self-pay | Admitting: Nurse Practitioner

## 2021-07-29 ENCOUNTER — Ambulatory Visit (INDEPENDENT_AMBULATORY_CARE_PROVIDER_SITE_OTHER): Payer: Medicaid Other | Admitting: Nurse Practitioner

## 2021-07-29 ENCOUNTER — Other Ambulatory Visit: Payer: Self-pay

## 2021-07-29 VITALS — BP 120/82 | HR 78 | Temp 97.9°F | Ht 62.0 in | Wt 206.8 lb

## 2021-07-29 DIAGNOSIS — G6289 Other specified polyneuropathies: Secondary | ICD-10-CM

## 2021-07-29 DIAGNOSIS — F331 Major depressive disorder, recurrent, moderate: Secondary | ICD-10-CM

## 2021-07-29 DIAGNOSIS — G629 Polyneuropathy, unspecified: Secondary | ICD-10-CM

## 2021-07-29 DIAGNOSIS — R5383 Other fatigue: Secondary | ICD-10-CM | POA: Diagnosis not present

## 2021-07-29 DIAGNOSIS — F411 Generalized anxiety disorder: Secondary | ICD-10-CM

## 2021-07-29 DIAGNOSIS — K219 Gastro-esophageal reflux disease without esophagitis: Secondary | ICD-10-CM

## 2021-07-29 DIAGNOSIS — D72829 Elevated white blood cell count, unspecified: Secondary | ICD-10-CM

## 2021-07-29 LAB — URINALYSIS, ROUTINE W REFLEX MICROSCOPIC
Bacteria, UA: NONE SEEN /HPF
Bilirubin Urine: NEGATIVE
Glucose, UA: NEGATIVE
Hgb urine dipstick: NEGATIVE
Hyaline Cast: NONE SEEN /LPF
Ketones, ur: NEGATIVE
Nitrite: NEGATIVE
Protein, ur: NEGATIVE
RBC / HPF: NONE SEEN /HPF (ref 0–2)
Specific Gravity, Urine: 1.02 (ref 1.001–1.035)
pH: 6 (ref 5.0–8.0)

## 2021-07-29 LAB — MICROSCOPIC MESSAGE

## 2021-07-29 MED ORDER — PREGABALIN 100 MG PO CAPS: 100.0000 mg | ORAL_CAPSULE | Freq: Two times a day (BID) | ORAL | 2 refills | Status: DC | PRN

## 2021-07-29 MED ORDER — PANTOPRAZOLE SODIUM 40 MG PO TBEC: 40.0000 mg | DELAYED_RELEASE_TABLET | Freq: Every day | ORAL | 1 refills | Status: DC

## 2021-07-29 MED ORDER — PREGABALIN 100 MG PO CAPS: 100.0000 mg | ORAL_CAPSULE | Freq: Three times a day (TID) | ORAL | 2 refills | Status: DC

## 2021-07-29 NOTE — Assessment & Plan Note (Signed)
Chronic, stable with current medications.  Continue collaboration with Psychaitry.  Follow up in 6 months.
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
## 2021-07-29 NOTE — Assessment & Plan Note (Signed)
I discussed possible causes at length again the with the patient today including polypharmacy vs. Sleep apnea or other sleep disorder.  I do think it would benefit her to have a consultation with a Pulmonologist.  She is agreeable after some discussion today.  Will place referral and I strongly encouraged patient to keep her appointment.

## 2021-07-29 NOTE — Assessment & Plan Note (Signed)
Acute on chronic, unclear cause.  No B symptoms today.  Will check UA, inflammatory makers, peripheral smear today.  Consider referral to Hematology if testing inconclusive.

## 2021-07-29 NOTE — Assessment & Plan Note (Signed)
Chronic, stable.  Plan to continue this medication for now.  Refills given.  Recent labs stable.  Follow up in 6 months.

## 2021-07-29 NOTE — Assessment & Plan Note (Signed)
Chronic.  Lyrica 100 mg twice daily appears to be controlling symptoms well.  PDMP reviewed and appropriate and refills given.  Follow up in 3 months.

## 2021-07-30 LAB — URINE CULTURE
MICRO NUMBER:: 12196704
Result:: NO GROWTH
SPECIMEN QUALITY:: ADEQUATE

## 2021-07-31 LAB — CBC WITH DIFFERENTIAL/PLATELET
Absolute Monocytes: 756 cells/uL (ref 200–950)
Basophils Absolute: 72 cells/uL (ref 0–200)
Basophils Relative: 0.6 %
Eosinophils Absolute: 252 cells/uL (ref 15–500)
Eosinophils Relative: 2.1 %
HCT: 45.3 % — ABNORMAL HIGH (ref 35.0–45.0)
Hemoglobin: 14.3 g/dL (ref 11.7–15.5)
Lymphs Abs: 2064 cells/uL (ref 850–3900)
MCH: 26.8 pg — ABNORMAL LOW (ref 27.0–33.0)
MCHC: 31.6 g/dL — ABNORMAL LOW (ref 32.0–36.0)
MCV: 85 fL (ref 80.0–100.0)
MPV: 11.3 fL (ref 7.5–12.5)
Monocytes Relative: 6.3 %
Neutro Abs: 8856 cells/uL — ABNORMAL HIGH (ref 1500–7800)
Neutrophils Relative %: 73.8 %
Platelets: 223 10*3/uL (ref 140–400)
RBC: 5.33 10*6/uL — ABNORMAL HIGH (ref 3.80–5.10)
RDW: 15.3 % — ABNORMAL HIGH (ref 11.0–15.0)
Total Lymphocyte: 17.2 %
WBC: 12 10*3/uL — ABNORMAL HIGH (ref 3.8–10.8)

## 2021-07-31 LAB — PATHOLOGIST SMEAR REVIEW

## 2021-07-31 LAB — ANA: Anti Nuclear Antibody (ANA): NEGATIVE

## 2021-07-31 LAB — C-REACTIVE PROTEIN: CRP: 12.8 mg/L — ABNORMAL HIGH (ref <8.0)

## 2021-07-31 LAB — SEDIMENTATION RATE: Sed Rate: 19 mm/h (ref 0–30)

## 2021-08-03 ENCOUNTER — Telehealth: Payer: Self-pay

## 2021-08-03 ENCOUNTER — Other Ambulatory Visit: Payer: Self-pay

## 2021-08-03 DIAGNOSIS — F17210 Nicotine dependence, cigarettes, uncomplicated: Secondary | ICD-10-CM

## 2021-08-03 NOTE — Telephone Encounter (Signed)
Low dose chest CT scan ordered

## 2021-08-27 ENCOUNTER — Other Ambulatory Visit: Payer: Self-pay | Admitting: Nurse Practitioner

## 2021-08-27 DIAGNOSIS — G6289 Other specified polyneuropathies: Secondary | ICD-10-CM

## 2021-09-07 ENCOUNTER — Ambulatory Visit
Admission: RE | Admit: 2021-09-07 | Discharge: 2021-09-07 | Disposition: A | Discharge status: Home / Self Care | Payer: Medicaid Other | Source: Ambulatory Visit | Attending: Nurse Practitioner | Admitting: Nurse Practitioner

## 2021-09-07 DIAGNOSIS — F17210 Nicotine dependence, cigarettes, uncomplicated: Secondary | ICD-10-CM

## 2021-09-08 ENCOUNTER — Encounter: Payer: Self-pay | Admitting: Nurse Practitioner

## 2021-09-08 DIAGNOSIS — J432 Centrilobular emphysema: Secondary | ICD-10-CM | POA: Insufficient documentation

## 2021-11-13 ENCOUNTER — Encounter: Payer: Self-pay | Admitting: Nurse Practitioner

## 2021-11-13 ENCOUNTER — Ambulatory Visit (INDEPENDENT_AMBULATORY_CARE_PROVIDER_SITE_OTHER): Payer: Medicaid Other | Admitting: Nurse Practitioner

## 2021-11-13 ENCOUNTER — Other Ambulatory Visit: Payer: Self-pay

## 2021-11-13 VITALS — BP 160/100 | HR 71 | Temp 97.1°F | Ht 62.0 in | Wt 201.4 lb

## 2021-11-13 DIAGNOSIS — D72829 Elevated white blood cell count, unspecified: Secondary | ICD-10-CM

## 2021-11-13 DIAGNOSIS — Z0000 Encounter for general adult medical examination without abnormal findings: Secondary | ICD-10-CM

## 2021-11-13 DIAGNOSIS — Z136 Encounter for screening for cardiovascular disorders: Secondary | ICD-10-CM

## 2021-11-13 DIAGNOSIS — F331 Major depressive disorder, recurrent, moderate: Secondary | ICD-10-CM | POA: Diagnosis not present

## 2021-11-13 DIAGNOSIS — Z124 Encounter for screening for malignant neoplasm of cervix: Secondary | ICD-10-CM | POA: Diagnosis not present

## 2021-11-13 DIAGNOSIS — F411 Generalized anxiety disorder: Secondary | ICD-10-CM

## 2021-11-13 DIAGNOSIS — R7303 Prediabetes: Secondary | ICD-10-CM

## 2021-11-13 DIAGNOSIS — Z13220 Encounter for screening for lipoid disorders: Secondary | ICD-10-CM

## 2021-11-13 DIAGNOSIS — R5383 Other fatigue: Secondary | ICD-10-CM

## 2021-11-13 DIAGNOSIS — I10 Essential (primary) hypertension: Secondary | ICD-10-CM

## 2021-11-13 MED ORDER — CITALOPRAM HYDROBROMIDE 40 MG PO TABS
40.0000 mg | ORAL_TABLET | Freq: Every day | ORAL | 1 refills | Status: DC
Start: 2021-11-13 — End: 2022-07-16
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
## 2021-11-14 LAB — LIPID PANEL
Cholesterol: 169 mg/dL (ref <200)
HDL: 43 mg/dL — ABNORMAL LOW (ref >=50)
LDL Cholesterol (Calc): 101 mg/dL (calc) — ABNORMAL HIGH
Non-HDL Cholesterol (Calc): 126 mg/dL (calc) (ref <130)
Total CHOL/HDL Ratio: 3.9 (calc) (ref <5.0)
Triglycerides: 150 mg/dL — ABNORMAL HIGH (ref <150)

## 2021-11-14 LAB — CBC WITH DIFFERENTIAL/PLATELET
Absolute Monocytes: 735 cells/uL (ref 200–950)
Basophils Absolute: 52 cells/uL (ref 0–200)
Basophils Relative: 0.4 %
Eosinophils Absolute: 181 cells/uL (ref 15–500)
Eosinophils Relative: 1.4 %
HCT: 43.3 % (ref 35.0–45.0)
Hemoglobin: 14.1 g/dL (ref 11.7–15.5)
Lymphs Abs: 2503 cells/uL (ref 850–3900)
MCH: 26.9 pg — ABNORMAL LOW (ref 27.0–33.0)
MCHC: 32.6 g/dL (ref 32.0–36.0)
MCV: 82.6 fL (ref 80.0–100.0)
MPV: 11.4 fL (ref 7.5–12.5)
Monocytes Relative: 5.7 %
Neutro Abs: 9430 cells/uL — ABNORMAL HIGH (ref 1500–7800)
Neutrophils Relative %: 73.1 %
Platelets: 197 10*3/uL (ref 140–400)
RBC: 5.24 10*6/uL — ABNORMAL HIGH (ref 3.80–5.10)
RDW: 16 % — ABNORMAL HIGH (ref 11.0–15.0)
Total Lymphocyte: 19.4 %
WBC: 12.9 10*3/uL — ABNORMAL HIGH (ref 3.8–10.8)

## 2021-11-14 LAB — COMPLETE METABOLIC PANEL WITH GFR
AG Ratio: 1.2 (calc) (ref 1.0–2.5)
ALT: 11 U/L (ref 6–29)
AST: 15 U/L (ref 10–35)
Albumin: 4 g/dL (ref 3.6–5.1)
Alkaline phosphatase (APISO): 86 U/L (ref 37–153)
BUN: 14 mg/dL (ref 7–25)
CO2: 32 mmol/L (ref 20–32)
Calcium: 9.1 mg/dL (ref 8.6–10.4)
Chloride: 103 mmol/L (ref 98–110)
Creat: 1 mg/dL (ref 0.50–1.05)
Globulin: 3.3 g/dL (calc) (ref 1.9–3.7)
Glucose, Bld: 83 mg/dL (ref 65–99)
Potassium: 4.4 mmol/L (ref 3.5–5.3)
Sodium: 142 mmol/L (ref 135–146)
Total Bilirubin: 0.4 mg/dL (ref 0.2–1.2)
Total Protein: 7.3 g/dL (ref 6.1–8.1)
eGFR: 63 mL/min/{1.73_m2} (ref >=60)

## 2021-11-14 LAB — HEMOGLOBIN A1C
Hgb A1c MFr Bld: 6.1 % of total Hgb — ABNORMAL HIGH (ref <5.7)
Mean Plasma Glucose: 128 mg/dL
eAG (mmol/L): 7.1 mmol/L

## 2021-11-14 LAB — MICROALBUMIN, URINE: Microalb, Ur: <0.2 mg/dL

## 2021-11-18 NOTE — Assessment & Plan Note (Signed)
Check HgbA1c today.  Treat as indicated.

## 2021-11-18 NOTE — Assessment & Plan Note (Addendum)
Chronic, improved slightly.  Suspect with decreasing lyrica, this has helped.  She is not yet ready for sleep study but will consider after next appointment.  Check CBC today - history of leukocytosis - smoking vs. OSA untreated

## 2021-11-18 NOTE — Assessment & Plan Note (Addendum)
Chronic.  BP is significantly elevated today in clinic, however patient is very anxious.  Will not adjust her BP medication at this time.  She will check BP at home and notify us with readings in 1 week.  Goal less than 140/90.  Continue losartan 100 mg daily, hctz 12.5 mg daily, and amlodipine 5 mg daily.  Check electrolytes with kidney function today.  Follow up 6 weeks.

## 2021-11-18 NOTE — Assessment & Plan Note (Signed)
Chronic.  PHQ-9 and GAD-7 are elevated today.  No SI/HI.  Patient sees Psychiatry, however we started Celexa 20 mg daily earlier this year.  Will increase Celexa to 40 mg daily.  Discussed counselor - she is interested but she cannot afford co-pay.  Will check with [facility] to see if there are Psychologists available for her to see and talk with.  Follow up in 6 weeks.

## 2021-11-19 LAB — PAP, TP IMAGING W/ HPV RNA, RFLX HPV TYPE 16,18/45: HPV DNA High Risk: DETECTED — AB

## 2021-11-19 LAB — HPV TYPE 16 AND 18/45 RNA
HPV Type 16 RNA: NOT DETECTED
HPV Type 18/45 RNA: NOT DETECTED

## 2022-01-13 ENCOUNTER — Other Ambulatory Visit: Payer: Self-pay | Admitting: Nurse Practitioner

## 2022-01-13 DIAGNOSIS — G6289 Other specified polyneuropathies: Secondary | ICD-10-CM

## 2022-01-14 MED ORDER — PREGABALIN 100 MG PO CAPS: ORAL_CAPSULE | ORAL | 0 refills | Status: DC

## 2022-01-14 NOTE — Addendum Note (Signed)
Addended by: Amado Coe on: 01/14/2022 08:42 AM   Modules accepted: Orders

## 2022-01-14 NOTE — Telephone Encounter (Signed)
PDMP reviewed and appropriate

## 2022-02-10 ENCOUNTER — Other Ambulatory Visit: Payer: Self-pay | Admitting: Nurse Practitioner

## 2022-02-10 DIAGNOSIS — G6289 Other specified polyneuropathies: Secondary | ICD-10-CM

## 2022-02-11 NOTE — Telephone Encounter (Signed)
LOV 11/13/21 Last refill 01/16/22, #60, 0 refills  Please review, thanks!

## 2022-03-19 ENCOUNTER — Other Ambulatory Visit: Payer: Self-pay | Admitting: Family Medicine

## 2022-03-19 DIAGNOSIS — G6289 Other specified polyneuropathies: Secondary | ICD-10-CM

## 2022-03-19 MED ORDER — PREGABALIN 100 MG PO CAPS: ORAL_CAPSULE | ORAL | 0 refills | Status: DC

## 2022-03-19 NOTE — Addendum Note (Signed)
Addended by: Wadie Lessen on: 03/19/2022 01:04 PM ? ? Modules accepted: Orders ? ?

## 2022-03-19 NOTE — Telephone Encounter (Signed)
LOV 11/13/21 ?Last refill 02/12/22, #60, 0 refills ? ?Please review, thanks! ? ?

## 2022-03-22 ENCOUNTER — Other Ambulatory Visit: Payer: Self-pay

## 2022-03-22 ENCOUNTER — Ambulatory Visit (INDEPENDENT_AMBULATORY_CARE_PROVIDER_SITE_OTHER): Payer: Medicaid Other | Admitting: Family Medicine

## 2022-03-22 VITALS — BP 122/78 | HR 83 | Temp 97.3°F | Ht 62.0 in | Wt 205.6 lb

## 2022-03-22 DIAGNOSIS — R7303 Prediabetes: Secondary | ICD-10-CM | POA: Diagnosis not present

## 2022-03-22 DIAGNOSIS — I10 Essential (primary) hypertension: Secondary | ICD-10-CM

## 2022-03-22 DIAGNOSIS — G253 Myoclonus: Secondary | ICD-10-CM | POA: Diagnosis not present

## 2022-03-22 MED ORDER — TRIAMCINOLONE ACETONIDE 0.1 % EX CREA: 1.0000 "application " | TOPICAL_CREAM | Freq: Two times a day (BID) | CUTANEOUS | 0 refills | Status: AC
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
## 2022-03-23 LAB — CBC WITH DIFFERENTIAL/PLATELET
Absolute Monocytes: 873 cells/uL (ref 200–950)
Basophils Absolute: 49 cells/uL (ref 0–200)
Basophils Relative: 0.4 %
Eosinophils Absolute: 221 cells/uL (ref 15–500)
Eosinophils Relative: 1.8 %
HCT: 44.8 % (ref 35.0–45.0)
Hemoglobin: 14.3 g/dL (ref 11.7–15.5)
Lymphs Abs: 2657 cells/uL (ref 850–3900)
MCH: 26.6 pg — ABNORMAL LOW (ref 27.0–33.0)
MCHC: 31.9 g/dL — ABNORMAL LOW (ref 32.0–36.0)
MCV: 83.3 fL (ref 80.0–100.0)
MPV: 11.6 fL (ref 7.5–12.5)
Monocytes Relative: 7.1 %
Neutro Abs: 8499 cells/uL — ABNORMAL HIGH (ref 1500–7800)
Neutrophils Relative %: 69.1 %
Platelets: 226 10*3/uL (ref 140–400)
RBC: 5.38 10*6/uL — ABNORMAL HIGH (ref 3.80–5.10)
RDW: 14.1 % (ref 11.0–15.0)
Total Lymphocyte: 21.6 %
WBC: 12.3 10*3/uL — ABNORMAL HIGH (ref 3.8–10.8)

## 2022-03-23 LAB — LIPID PANEL
Cholesterol: 151 mg/dL (ref <200)
HDL: 43 mg/dL — ABNORMAL LOW (ref >=50)
LDL Cholesterol (Calc): 83 mg/dL (calc)
Non-HDL Cholesterol (Calc): 108 mg/dL (calc) (ref <130)
Total CHOL/HDL Ratio: 3.5 (calc) (ref <5.0)
Triglycerides: 149 mg/dL (ref <150)

## 2022-03-23 LAB — COMPLETE METABOLIC PANEL WITH GFR
AG Ratio: 1.1 (calc) (ref 1.0–2.5)
ALT: 15 U/L (ref 6–29)
AST: 16 U/L (ref 10–35)
Albumin: 3.9 g/dL (ref 3.6–5.1)
Alkaline phosphatase (APISO): 74 U/L (ref 37–153)
BUN/Creatinine Ratio: 11 (calc) (ref 6–22)
BUN: 12 mg/dL (ref 7–25)
CO2: 32 mmol/L (ref 20–32)
Calcium: 8.9 mg/dL (ref 8.6–10.4)
Chloride: 103 mmol/L (ref 98–110)
Creat: 1.11 mg/dL — ABNORMAL HIGH (ref 0.50–1.05)
Globulin: 3.4 g/dL (calc) (ref 1.9–3.7)
Glucose, Bld: 65 mg/dL (ref 65–99)
Potassium: 4.9 mmol/L (ref 3.5–5.3)
Sodium: 141 mmol/L (ref 135–146)
Total Bilirubin: 0.2 mg/dL (ref 0.2–1.2)
Total Protein: 7.3 g/dL (ref 6.1–8.1)
eGFR: 56 mL/min/{1.73_m2} — ABNORMAL LOW (ref >=60)

## 2022-03-23 LAB — HEMOGLOBIN A1C
Hgb A1c MFr Bld: 6.1 % of total Hgb — ABNORMAL HIGH (ref <5.7)
Mean Plasma Glucose: 128 mg/dL
eAG (mmol/L): 7.1 mmol/L

## 2022-04-05 ENCOUNTER — Ambulatory Visit: Payer: Medicaid Other | Admitting: Neurology

## 2022-04-05 ENCOUNTER — Encounter: Payer: Self-pay | Admitting: Neurology

## 2022-04-05 VITALS — BP 138/88 | HR 69 | Ht 62.0 in | Wt 210.0 lb

## 2022-04-05 DIAGNOSIS — R251 Tremor, unspecified: Secondary | ICD-10-CM

## 2022-04-05 DIAGNOSIS — R569 Unspecified convulsions: Secondary | ICD-10-CM
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
## 2022-04-05 NOTE — Patient Instructions (Signed)
Routine EEG, I will contact you to go over the results  ?Recommend smoking cessation  ?Follow up with your PCP  ?Return as needed  ?

## 2022-04-07 ENCOUNTER — Other Ambulatory Visit: Payer: Medicaid Other | Admitting: *Deleted

## 2022-04-09 ENCOUNTER — Telehealth: Payer: Self-pay

## 2022-04-09 NOTE — Telephone Encounter (Signed)
Also advice pt to go to Urgent care if she is having a hard time breathing/SOB, advice pt call EMS if she can't have anyone to take her. However, I will send message to Dr. Dennard Schaumann to prescribe something for her. ?

## 2022-04-09 NOTE — Telephone Encounter (Signed)
Just spoke with pt and she is having a hard time breathig short of breath, can you call something for there today. ?

## 2022-04-12 NOTE — Telephone Encounter (Signed)
Pt has OV 04/16/22 10:15 ?

## 2022-04-14 ENCOUNTER — Ambulatory Visit: Payer: Medicaid Other | Admitting: Neurology

## 2022-04-14 DIAGNOSIS — R569 Unspecified convulsions: Secondary | ICD-10-CM

## 2022-04-14 NOTE — Procedures (Signed)
? ? ?  History: ? ?66 year old woman with seizure like activity  ? ?EEG classification: Awake and drowsy ? ?Description of the recording: The background rhythms of this recording consists of a fairly well modulated medium amplitude alpha rhythm of 8 Hz that is reactive to eye opening and closure. As the record progresses, the patient appears to remain in the waking state throughout the recording. Photic stimulation was performed, did not show any abnormalities. Hyperventilation was also performed, did not show any abnormalities. Toward the end of the recording, the patient enters the drowsy state with slight symmetric slowing seen. The patient never enters stage II sleep. No abnormal epileptiform discharges seen during this recording. There was intermitted mild diffuse slowing. EKG monitor shows no evidence of cardiac rhythm abnormalities with a heart rate of 66. ? ?Abnormality: Intermittent mild diffuse slowing ? ?Impression: This is an abnormal EEG recording in the waking and drowsy state due to intermittent mild diffuse slowing. Diffuse slowing is consistent with a generalized brain dysfunction such as in encephalopathy, nonspecific.  ? ? ?Alric Ran, MD ?[facility] ? ? ?

## 2022-04-16 ENCOUNTER — Encounter: Payer: Self-pay | Admitting: Family Medicine

## 2022-04-16 ENCOUNTER — Ambulatory Visit (INDEPENDENT_AMBULATORY_CARE_PROVIDER_SITE_OTHER): Payer: Medicaid Other | Admitting: Family Medicine

## 2022-04-16 VITALS — BP 132/92 | HR 75 | Temp 98.4°F | Ht 62.0 in | Wt 202.8 lb

## 2022-04-16 DIAGNOSIS — R0600 Dyspnea, unspecified: Secondary | ICD-10-CM

## 2022-04-16 MED ORDER — CHANTIX STARTING MONTH PAK 0.5 MG X 11 & 1 MG X 42 PO TBPK: ORAL_TABLET | ORAL | 0 refills | Status: DC
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
## 2022-04-19 ENCOUNTER — Ambulatory Visit
Admission: RE | Admit: 2022-04-19 | Discharge: 2022-04-19 | Disposition: A | Discharge status: Home / Self Care | Payer: Medicaid Other | Source: Ambulatory Visit | Attending: Family Medicine | Admitting: Family Medicine

## 2022-04-19 DIAGNOSIS — R0600 Dyspnea, unspecified: Secondary | ICD-10-CM

## 2022-05-01 ENCOUNTER — Other Ambulatory Visit: Payer: Self-pay | Admitting: Nurse Practitioner

## 2022-05-01 ENCOUNTER — Other Ambulatory Visit: Payer: Self-pay | Admitting: Family Medicine

## 2022-05-01 DIAGNOSIS — I10 Essential (primary) hypertension: Secondary | ICD-10-CM

## 2022-05-01 DIAGNOSIS — G6289 Other specified polyneuropathies: Secondary | ICD-10-CM

## 2022-05-03 NOTE — Telephone Encounter (Signed)
Requested medication (s) are due for refill today - yes ? ?Requested medication (s) are on the active medication list -yes ? ?Future visit scheduled -yes ? ?Last refill: 03/19/22 #60 ? ?Notes to clinic: non delegated Rx ? ?Requested Prescriptions  ?Pending Prescriptions Disp Refills  ? pregabalin (LYRICA) 100 MG capsule [Pharmacy Med Name: Pregabalin 100 MG Oral Capsule] 60 capsule 0  ?  Sig: TAKE 1 CAPSULE BY MOUTH TWICE DAILY AS NEEDED. PATIENT NEEDS NEW PCP.  ?  ? Not Delegated - Neurology:  Anticonvulsants - Controlled - pregabalin Failed - 05/01/2022  5:30 AM  ?  ?  Failed - This refill cannot be delegated  ?  ?  Failed - Cr in normal range and within 360 days  ?  Creat  ?Date Value Ref Range Status  ?03/22/2022 1.11 (H) 0.50 - 1.05 mg/dL Final  ?  ?  ?  ?  Passed - Completed PHQ-2 or PHQ-9 in the last 360 days  ?  ?  Passed - Valid encounter within last 12 months  ?  Recent Outpatient Visits   ? ?      ? 2 weeks ago Dyspnea, unspecified type  ? [facility] Pickard, Cammie Mcgee, MD  ? 1 month ago Prediabetes  ? [facility] Pickard, Cammie Mcgee, MD  ? 5 months ago Annual physical exam  ? [facility] Noemi Chapel A, NP  ? 9 months ago Fatigue, unspecified type  ? [place], Jessica A, NP  ? 11 months ago Moderate episode of recurrent major depressive disorder ([place])  ? [place] Eulogio Bear, NP  ? ?  ?  ?Future Appointments   ? ?        ? In 2 weeks Pickard, Cammie Mcgee, MD [place]  ? ?  ? ? ?  ?  ?  ? ? ? ?Requested Prescriptions  ?Pending Prescriptions Disp Refills  ? pregabalin (LYRICA) 100 MG capsule [Pharmacy Med Name: Pregabalin 100 MG Oral Capsule] 60 capsule 0  ?  Sig: TAKE 1 CAPSULE BY MOUTH TWICE DAILY AS NEEDED. PATIENT NEEDS NEW PCP.  ?  ? Not Delegated - Neurology:  Anticonvulsants - Controlled - pregabalin Failed - 05/01/2022  5:30 AM  ?  ?  Failed - This refill cannot be  delegated  ?  ?  Failed - Cr in normal range and within 360 days  ?  Creat  ?Date Value Ref Range Status  ?03/22/2022 1.11 (H) 0.50 - 1.05 mg/dL Final  ?  ?  ?  ?  Passed - Completed PHQ-2 or PHQ-9 in the last 360 days  ?  ?  Passed - Valid encounter within last 12 months  ?  Recent Outpatient Visits   ? ?      ? 2 weeks ago Dyspnea, unspecified type  ? [facility] Pickard, Cammie Mcgee, MD  ? 1 month ago Prediabetes  ? [place] Pickard, Cammie Mcgee, MD  ? 5 months ago Annual physical exam  ? [facility] Noemi Chapel A, NP  ? 9 months ago Fatigue, unspecified type  ? [place], Jessica A, NP  ? 11 months ago Moderate episode of recurrent major depressive disorder ([place])  ? [place] Eulogio Bear, NP  ? ?  ?  ?Future Appointments   ? ?        ?  In 2 weeks Pickard, Cammie Mcgee, MD [place]  ? ?  ? ? ?  ?  ?  ? ? ? ?

## 2022-05-03 NOTE — Telephone Encounter (Signed)
LOV 04/16/22 ?Last refill 03/19/22, #60, 0 refills ? ?Please review, thanks! ? ?

## 2022-05-03 NOTE — Telephone Encounter (Signed)
Requested Prescriptions  ?Pending Prescriptions Disp Refills  ?? hydrochlorothiazide (MICROZIDE) 12.5 MG capsule [Pharmacy Med Name: hydroCHLOROthiazide 12.5 MG Oral Capsule] 30 capsule 0  ?  Sig: Take 1 capsule by mouth once daily  ?  ? Cardiovascular: Diuretics - Thiazide Failed - 05/01/2022  5:30 AM  ?  ?  Failed - Cr in normal range and within 180 days  ?  Creat  ?Date Value Ref Range Status  ?03/22/2022 1.11 (H) 0.50 - 1.05 mg/dL Final  ?   ?  ?  Failed - Last BP in normal range  ?  BP Readings from Last 1 Encounters:  ?04/16/22 (!) 132/92  ?   ?  ?  Passed - K in normal range and within 180 days  ?  Potassium  ?Date Value Ref Range Status  ?03/22/2022 4.9 3.5 - 5.3 mmol/L Final  ?   ?  ?  Passed - Na in normal range and within 180 days  ?  Sodium  ?Date Value Ref Range Status  ?03/22/2022 141 135 - 146 mmol/L Final  ?   ?  ?  Passed - Valid encounter within last 6 months  ?  Recent Outpatient Visits   ?      ? 2 weeks ago Dyspnea, unspecified type  ? [facility] Pickard, Cammie Mcgee, MD  ? 1 month ago Prediabetes  ? [place] Pickard, Cammie Mcgee, MD  ? 5 months ago Annual physical exam  ? [facility] Noemi Chapel A, NP  ? 9 months ago Fatigue, unspecified type  ? [place], Jessica A, NP  ? 11 months ago Moderate episode of recurrent major depressive disorder ([place])  ? [place] Eulogio Bear, NP  ?  ?  ?Future Appointments   ?        ? In 2 weeks Pickard, Cammie Mcgee, MD [place]  ?  ? ?  ?  ?  ? ?

## 2022-05-17 ENCOUNTER — Ambulatory Visit (INDEPENDENT_AMBULATORY_CARE_PROVIDER_SITE_OTHER): Payer: Medicaid Other | Admitting: Family Medicine

## 2022-05-17 VITALS — BP 120/70 | HR 75 | Temp 98.1°F | Ht 62.0 in | Wt 200.8 lb

## 2022-05-17 DIAGNOSIS — J449 Chronic obstructive pulmonary disease, unspecified: Secondary | ICD-10-CM | POA: Diagnosis not present

## 2022-05-17 DIAGNOSIS — G6289 Other specified polyneuropathies: Secondary | ICD-10-CM

## 2022-05-17 MED ORDER — PREGABALIN 100 MG PO CAPS: ORAL_CAPSULE | ORAL | 1 refills | Status: DC

## 2022-05-17 MED ORDER — TRELEGY ELLIPTA 200-62.5-25 MCG/ACT IN AEPB: 1.0000 | INHALATION_SPRAY | Freq: Every day | RESPIRATORY_TRACT | 11 refills | Status: DC
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
## 2022-05-23 ENCOUNTER — Other Ambulatory Visit: Payer: Self-pay | Admitting: Nurse Practitioner

## 2022-05-23 DIAGNOSIS — K219 Gastro-esophageal reflux disease without esophagitis: Secondary | ICD-10-CM

## 2022-05-23 DIAGNOSIS — R5383 Other fatigue: Secondary | ICD-10-CM

## 2022-05-25 NOTE — Telephone Encounter (Signed)
Requested Prescriptions  Pending Prescriptions Disp Refills  . pantoprazole (PROTONIX) 40 MG tablet [Pharmacy Med Name: Pantoprazole Sodium 40 MG Oral Tablet Delayed Release] 90 tablet 0    Sig: Take 1 tablet by mouth once daily     Gastroenterology: Proton Pump Inhibitors Passed - 05/23/2022  5:16 PM      Passed - Valid encounter within last 12 months    Recent Outpatient Visits          1 week ago Chronic obstructive pulmonary disease, unspecified COPD type ([place])   [place] Pickard, Cammie Mcgee, MD   1 month ago Dyspnea, unspecified type   [place] Susy Frizzle, MD   2 months ago Prediabetes   [place] Schaumann, Cammie Mcgee, MD   6 months ago Annual physical exam   [place] Eulogio Bear, NP   10 months ago Fatigue, unspecified type   [place] Eulogio Bear, NP

## 2022-06-28 ENCOUNTER — Other Ambulatory Visit: Payer: Self-pay | Admitting: Family Medicine

## 2022-06-28 DIAGNOSIS — Z1231 Encounter for screening mammogram for malignant neoplasm of breast: Secondary | ICD-10-CM

## 2022-07-15 ENCOUNTER — Other Ambulatory Visit: Payer: Self-pay | Admitting: Family Medicine

## 2022-07-15 ENCOUNTER — Other Ambulatory Visit: Payer: Self-pay | Admitting: Nurse Practitioner

## 2022-07-15 DIAGNOSIS — I10 Essential (primary) hypertension: Secondary | ICD-10-CM

## 2022-07-15 DIAGNOSIS — F411 Generalized anxiety disorder: Secondary | ICD-10-CM

## 2022-07-16 NOTE — Telephone Encounter (Signed)
Requested Prescriptions  Pending Prescriptions Disp Refills  . citalopram (CELEXA) 40 MG tablet [Pharmacy Med Name: Citalopram Hydrobromide 40 MG Oral Tablet] 90 tablet 0    Sig: Take 1 tablet by mouth once daily     Psychiatry:  Antidepressants - SSRI Passed - 07/15/2022  1:07 PM      Passed - Completed PHQ-2 or PHQ-9 in the last 360 days      Passed - Valid encounter within last 6 months    Recent Outpatient Visits          2 months ago Chronic obstructive pulmonary disease, unspecified COPD type ([place])   [place] Pickard, Cammie Mcgee, MD   3 months ago Dyspnea, unspecified type   [place] Susy Frizzle, MD   3 months ago Prediabetes   [place] Schaumann, Cammie Mcgee, MD   8 months ago Annual physical exam   [place] Eulogio Bear, NP   11 months ago Fatigue, unspecified type   [place] Eulogio Bear, NP

## 2022-07-16 NOTE — Telephone Encounter (Signed)
Requested medication (s) are due for refill today: yes  Requested medication (s) are on the active medication list: yes  Last refill:  05/03/22 #30 0 refills  Future visit scheduled: no  Notes to clinic:  do you want to refill Rx?     Requested Prescriptions  Pending Prescriptions Disp Refills   hydrochlorothiazide (MICROZIDE) 12.5 MG capsule [Pharmacy Med Name: hydroCHLOROthiazide 12.5 MG Oral Capsule] 30 capsule 0    Sig: Take 1 capsule by mouth once daily     Cardiovascular: Diuretics - Thiazide Failed - 07/16/2022 10:24 AM      Failed - Cr in normal range and within 180 days    Creat  Date Value Ref Range Status  03/22/2022 1.11 (H) 0.50 - 1.05 mg/dL Final         Passed - K in normal range and within 180 days    Potassium  Date Value Ref Range Status  03/22/2022 4.9 3.5 - 5.3 mmol/L Final         Passed - Na in normal range and within 180 days    Sodium  Date Value Ref Range Status  03/22/2022 141 135 - 146 mmol/L Final         Passed - Last BP in normal range    BP Readings from Last 1 Encounters:  05/17/22 120/70         Passed - Valid encounter within last 6 months    Recent Outpatient Visits           2 months ago Chronic obstructive pulmonary disease, unspecified COPD type ([place])   [place] Susy Frizzle, MD   3 months ago Dyspnea, unspecified type   [place] Pickard, Cammie Mcgee, MD   3 months ago Prediabetes   [place]enn[place] Schaumann, Cammie Mcgee, MD   8 months ago Annual physical exam   [place] Eulogio Bear, NP   11 months ago Fatigue, unspecified type   [place] Eulogio Bear, NP

## 2022-07-27 ENCOUNTER — Ambulatory Visit
Admission: RE | Admit: 2022-07-27 | Discharge: 2022-07-27 | Disposition: A | Discharge status: Home / Self Care | Payer: Medicaid Other | Source: Ambulatory Visit | Attending: Family Medicine | Admitting: Family Medicine

## 2022-07-27 DIAGNOSIS — Z1231 Encounter for screening mammogram for malignant neoplasm of breast: Secondary | ICD-10-CM

## 2022-08-10 ENCOUNTER — Other Ambulatory Visit: Payer: Self-pay | Admitting: Family Medicine

## 2022-08-10 DIAGNOSIS — I10 Essential (primary) hypertension: Secondary | ICD-10-CM

## 2022-08-11 NOTE — Telephone Encounter (Signed)
Requested Prescriptions  Pending Prescriptions Disp Refills  . hydrochlorothiazide (MICROZIDE) 12.5 MG capsule [Pharmacy Med Name: hydroCHLOROthiazide 12.5 MG Oral Capsule] 90 capsule 1    Sig: Take 1 capsule by mouth once daily     Cardiovascular: Diuretics - Thiazide Failed - 08/10/2022  7:16 PM      Failed - Cr in normal range and within 180 days    Creat  Date Value Ref Range Status  03/22/2022 1.11 (H) 0.50 - 1.05 mg/dL Final         Passed - K in normal range and within 180 days    Potassium  Date Value Ref Range Status  03/22/2022 4.9 3.5 - 5.3 mmol/L Final         Passed - Na in normal range and within 180 days    Sodium  Date Value Ref Range Status  03/22/2022 141 135 - 146 mmol/L Final         Passed - Last BP in normal range    BP Readings from Last 1 Encounters:  05/17/22 120/70         Passed - Valid encounter within last 6 months    Recent Outpatient Visits          2 months ago Chronic obstructive pulmonary disease, unspecified COPD type ([place])   [place] Susy Frizzle, MD   3 months ago Dyspnea, unspecified type   [place] Pickard, Cammie Mcgee, MD   4 months ago Prediabetes   [place] Denn[place] Schaumann, Cammie Mcgee, MD   9 months ago Annual physical exam   [place] Eulogio Bear, NP   1 year ago Fatigue, unspecified type   [place] Eulogio Bear, NP

## 2022-08-16 ENCOUNTER — Other Ambulatory Visit: Payer: Self-pay | Admitting: Family Medicine

## 2022-08-16 DIAGNOSIS — K219 Gastro-esophageal reflux disease without esophagitis: Secondary | ICD-10-CM

## 2022-08-16 DIAGNOSIS — R5383 Other fatigue: Secondary | ICD-10-CM

## 2022-09-22 ENCOUNTER — Telehealth: Payer: Self-pay

## 2022-09-22 NOTE — Telephone Encounter (Signed)
Pt called requesting refill on the inhaler that you gave her samples of in May. From the chart notes, it looks like it was Trelegy. Thank you.

## 2022-09-23 ENCOUNTER — Other Ambulatory Visit: Payer: Self-pay | Admitting: Family Medicine

## 2022-09-23 MED ORDER — TRELEGY ELLIPTA 200-62.5-25 MCG/ACT IN AEPB: 1.0000 | INHALATION_SPRAY | Freq: Every day | RESPIRATORY_TRACT | 11 refills | Status: AC

## 2022-10-12 ENCOUNTER — Other Ambulatory Visit: Payer: Self-pay | Admitting: Family Medicine

## 2022-10-12 DIAGNOSIS — F411 Generalized anxiety disorder: Secondary | ICD-10-CM

## 2022-10-12 NOTE — Telephone Encounter (Signed)
Got her scheduled for 6 month check for 10/15/2022 with Dr.Pickard.   A 30 day courtesy supply of the Celexa 40 mg given.

## 2022-10-15 ENCOUNTER — Ambulatory Visit: Payer: Medicaid Other | Admitting: Family Medicine

## 2022-10-18 ENCOUNTER — Other Ambulatory Visit: Payer: Self-pay | Admitting: Family Medicine

## 2022-10-18 DIAGNOSIS — G6289 Other specified polyneuropathies: Secondary | ICD-10-CM

## 2022-10-22 ENCOUNTER — Encounter: Payer: Self-pay | Admitting: Family Medicine

## 2022-10-22 ENCOUNTER — Ambulatory Visit (INDEPENDENT_AMBULATORY_CARE_PROVIDER_SITE_OTHER): Payer: Medicaid Other | Admitting: Family Medicine

## 2022-10-22 VITALS — BP 132/82 | HR 82 | Ht 62.0 in | Wt 210.8 lb

## 2022-10-22 DIAGNOSIS — J449 Chronic obstructive pulmonary disease, unspecified: Secondary | ICD-10-CM

## 2022-10-22 DIAGNOSIS — F411 Generalized anxiety disorder: Secondary | ICD-10-CM

## 2022-10-22 DIAGNOSIS — I10 Essential (primary) hypertension: Secondary | ICD-10-CM | POA: Diagnosis not present

## 2022-10-22 DIAGNOSIS — R7303 Prediabetes: Secondary | ICD-10-CM

## 2022-10-22 MED ORDER — ALBUTEROL SULFATE HFA 108 (90 BASE) MCG/ACT IN AERS: 2.0000 | INHALATION_SPRAY | Freq: Four times a day (QID) | RESPIRATORY_TRACT | 0 refills | Status: DC | PRN

## 2022-10-22 MED ORDER — CITALOPRAM HYDROBROMIDE 40 MG PO TABS: ORAL_TABLET | ORAL | 3 refills | Status: DC

## 2022-10-22 MED ORDER — HYDROXYZINE PAMOATE 25 MG PO CAPS: 25.0000 mg | ORAL_CAPSULE | Freq: Three times a day (TID) | ORAL | 0 refills | Status: DC | PRN
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
## 2022-10-23 LAB — LIPID PANEL
Cholesterol: 174 mg/dL (ref <200)
HDL: 50 mg/dL (ref >=50)
LDL Cholesterol (Calc): 96 mg/dL (calc)
Non-HDL Cholesterol (Calc): 124 mg/dL (calc) (ref <130)
Total CHOL/HDL Ratio: 3.5 (calc) (ref <5.0)
Triglycerides: 180 mg/dL — ABNORMAL HIGH (ref <150)

## 2022-10-23 LAB — CBC WITH DIFFERENTIAL/PLATELET
Absolute Monocytes: 552 cells/uL (ref 200–950)
Basophils Absolute: 53 cells/uL (ref 0–200)
Basophils Relative: 0.6 %
Eosinophils Absolute: 409 cells/uL (ref 15–500)
Eosinophils Relative: 4.6 %
HCT: 42.9 % (ref 35.0–45.0)
Hemoglobin: 14.2 g/dL (ref 11.7–15.5)
Lymphs Abs: 2528 cells/uL (ref 850–3900)
MCH: 28.7 pg (ref 27.0–33.0)
MCHC: 33.1 g/dL (ref 32.0–36.0)
MCV: 86.8 fL (ref 80.0–100.0)
MPV: 10.8 fL (ref 7.5–12.5)
Monocytes Relative: 6.2 %
Neutro Abs: 5358 cells/uL (ref 1500–7800)
Neutrophils Relative %: 60.2 %
Platelets: 238 10*3/uL (ref 140–400)
RBC: 4.94 10*6/uL (ref 3.80–5.10)
RDW: 13.3 % (ref 11.0–15.0)
Total Lymphocyte: 28.4 %
WBC: 8.9 10*3/uL (ref 3.8–10.8)

## 2022-10-23 LAB — COMPLETE METABOLIC PANEL WITH GFR
AG Ratio: 1.4 (calc) (ref 1.0–2.5)
ALT: 12 U/L (ref 6–29)
AST: 15 U/L (ref 10–35)
Albumin: 4.1 g/dL (ref 3.6–5.1)
Alkaline phosphatase (APISO): 71 U/L (ref 37–153)
BUN/Creatinine Ratio: 12 (calc) (ref 6–22)
BUN: 14 mg/dL (ref 7–25)
CO2: 30 mmol/L (ref 20–32)
Calcium: 9.1 mg/dL (ref 8.6–10.4)
Chloride: 104 mmol/L (ref 98–110)
Creat: 1.17 mg/dL — ABNORMAL HIGH (ref 0.50–1.05)
Globulin: 3 g/dL (calc) (ref 1.9–3.7)
Glucose, Bld: 96 mg/dL (ref 65–99)
Potassium: 4.7 mmol/L (ref 3.5–5.3)
Sodium: 142 mmol/L (ref 135–146)
Total Bilirubin: 0.2 mg/dL (ref 0.2–1.2)
Total Protein: 7.1 g/dL (ref 6.1–8.1)
eGFR: 52 mL/min/{1.73_m2} — ABNORMAL LOW (ref >=60)

## 2022-10-23 LAB — HEMOGLOBIN A1C
Hgb A1c MFr Bld: 6.4 % of total Hgb — ABNORMAL HIGH (ref <5.7)
Mean Plasma Glucose: 137 mg/dL
eAG (mmol/L): 7.6 mmol/L

## 2022-11-07 ENCOUNTER — Other Ambulatory Visit: Payer: Self-pay | Admitting: Family Medicine

## 2022-11-08 NOTE — Telephone Encounter (Signed)
Requested Prescriptions  Pending Prescriptions Disp Refills   VENTOLIN HFA 108 (90 Base) MCG/ACT inhaler [Pharmacy Med Name: Ventolin HFA 108 (90 Base) MCG/ACT Inhalation Aerosol Solution] 18 g 2    Sig: INHALE 2 PUFFS BY MOUTH EVERY 6 HOURS AS NEEDED FOR WHEEZING FOR SHORTNESS OF BREATH     Pulmonology:  Beta Agonists 2 Passed - 11/07/2022 12:39 PM      Passed - Last BP in normal range    BP Readings from Last 1 Encounters:  10/22/22 132/82         Passed - Last Heart Rate in normal range    Pulse Readings from Last 1 Encounters:  10/22/22 82         Passed - Valid encounter within last 12 months    Recent Outpatient Visits           5 months ago Chronic obstructive pulmonary disease, unspecified COPD type ([place])   [place] Pickard, Cammie Mcgee, MD   6 months ago Dyspnea, unspecified type   [place] Susy Frizzle, MD   7 months ago Prediabetes   [place] Schaumann, Cammie Mcgee, MD   12 months ago Annual physical exam   [place] [facility] Eulogio Bear, NP   1 year ago Fatigue, unspecified type   [place] Eulogio Bear, NP

## 2022-11-16 ENCOUNTER — Telehealth: Payer: Self-pay | Admitting: Family Medicine

## 2022-12-01 DIAGNOSIS — K5900 Constipation, unspecified: Secondary | ICD-10-CM | POA: Insufficient documentation

## 2022-12-01 DIAGNOSIS — R1310 Dysphagia, unspecified: Secondary | ICD-10-CM | POA: Insufficient documentation

## 2022-12-10 DIAGNOSIS — M79671 Pain in right foot: Secondary | ICD-10-CM | POA: Diagnosis not present

## 2022-12-10 DIAGNOSIS — Z79891 Long term (current) use of opiate analgesic: Secondary | ICD-10-CM | POA: Diagnosis not present

## 2022-12-10 DIAGNOSIS — G629 Polyneuropathy, unspecified: Secondary | ICD-10-CM | POA: Diagnosis not present

## 2022-12-10 DIAGNOSIS — G44211 Episodic tension-type headache, intractable: Secondary | ICD-10-CM | POA: Diagnosis not present

## 2022-12-10 DIAGNOSIS — M545 Low back pain, unspecified: Secondary | ICD-10-CM | POA: Diagnosis not present

## 2022-12-10 DIAGNOSIS — G894 Chronic pain syndrome: Secondary | ICD-10-CM | POA: Diagnosis not present

## 2022-12-10 DIAGNOSIS — M5136 Other intervertebral disc degeneration, lumbar region: Secondary | ICD-10-CM | POA: Diagnosis not present

## 2022-12-10 DIAGNOSIS — E1143 Type 2 diabetes mellitus with diabetic autonomic (poly)neuropathy: Secondary | ICD-10-CM | POA: Diagnosis not present

## 2022-12-24 ENCOUNTER — Encounter: Payer: Self-pay | Admitting: Family Medicine

## 2022-12-24 ENCOUNTER — Ambulatory Visit (INDEPENDENT_AMBULATORY_CARE_PROVIDER_SITE_OTHER): Payer: Medicare Other | Admitting: Family Medicine

## 2022-12-24 VITALS — BP 126/74 | HR 93 | Ht 62.0 in | Wt 210.0 lb

## 2022-12-24 DIAGNOSIS — Z1211 Encounter for screening for malignant neoplasm of colon: Secondary | ICD-10-CM | POA: Diagnosis not present

## 2022-12-24 DIAGNOSIS — G6289 Other specified polyneuropathies: Secondary | ICD-10-CM | POA: Diagnosis not present

## 2022-12-24 DIAGNOSIS — Z122 Encounter for screening for malignant neoplasm of respiratory organs: Secondary | ICD-10-CM | POA: Diagnosis not present

## 2022-12-24 DIAGNOSIS — Z23 Encounter for immunization: Secondary | ICD-10-CM | POA: Diagnosis not present

## 2022-12-24 DIAGNOSIS — F411 Generalized anxiety disorder: Secondary | ICD-10-CM | POA: Diagnosis not present

## 2022-12-24 DIAGNOSIS — F17200 Nicotine dependence, unspecified, uncomplicated: Secondary | ICD-10-CM

## 2022-12-24 MED ORDER — PREGABALIN 100 MG PO CAPS: ORAL_CAPSULE | ORAL | 1 refills | Status: DC

## 2022-12-24 MED ORDER — NALOXEGOL OXALATE 25 MG PO TABS: 25.0000 mg | ORAL_TABLET | Freq: Every day | ORAL | 3 refills | Status: DC

## 2022-12-24 MED ORDER — CITALOPRAM HYDROBROMIDE 40 MG PO TABS: ORAL_TABLET | ORAL | 3 refills | Status: DC

## 2022-12-24 MED ORDER — HYDROXYZINE PAMOATE 25 MG PO CAPS: 25.0000 mg | ORAL_CAPSULE | Freq: Three times a day (TID) | ORAL | 3 refills | Status: DC | PRN
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
## 2023-01-07 DIAGNOSIS — G629 Polyneuropathy, unspecified: Secondary | ICD-10-CM | POA: Diagnosis not present

## 2023-01-07 DIAGNOSIS — M79671 Pain in right foot: Secondary | ICD-10-CM | POA: Diagnosis not present

## 2023-01-07 DIAGNOSIS — G44211 Episodic tension-type headache, intractable: Secondary | ICD-10-CM | POA: Diagnosis not present

## 2023-01-07 DIAGNOSIS — Z79891 Long term (current) use of opiate analgesic: Secondary | ICD-10-CM | POA: Diagnosis not present

## 2023-01-07 DIAGNOSIS — G894 Chronic pain syndrome: Secondary | ICD-10-CM | POA: Diagnosis not present

## 2023-01-07 DIAGNOSIS — M545 Low back pain, unspecified: Secondary | ICD-10-CM | POA: Diagnosis not present

## 2023-01-07 DIAGNOSIS — E1143 Type 2 diabetes mellitus with diabetic autonomic (poly)neuropathy: Secondary | ICD-10-CM | POA: Diagnosis not present

## 2023-01-07 DIAGNOSIS — M5136 Other intervertebral disc degeneration, lumbar region: Secondary | ICD-10-CM | POA: Diagnosis not present

## 2023-01-10 ENCOUNTER — Ambulatory Visit (HOSPITAL_COMMUNITY)
Admission: RE | Admit: 2023-01-10 | Discharge: 2023-01-10 | Disposition: A | Discharge status: Home / Self Care | Payer: 59 | Source: Ambulatory Visit | Attending: Family Medicine | Admitting: Family Medicine

## 2023-01-10 DIAGNOSIS — J439 Emphysema, unspecified: Secondary | ICD-10-CM | POA: Insufficient documentation

## 2023-01-10 DIAGNOSIS — Z87891 Personal history of nicotine dependence: Secondary | ICD-10-CM | POA: Diagnosis not present

## 2023-01-10 DIAGNOSIS — Z122 Encounter for screening for malignant neoplasm of respiratory organs: Secondary | ICD-10-CM | POA: Insufficient documentation

## 2023-01-10 DIAGNOSIS — F17200 Nicotine dependence, unspecified, uncomplicated: Secondary | ICD-10-CM | POA: Insufficient documentation

## 2023-01-24 ENCOUNTER — Other Ambulatory Visit: Payer: Self-pay | Admitting: Family Medicine

## 2023-02-04 DIAGNOSIS — G894 Chronic pain syndrome: Secondary | ICD-10-CM | POA: Diagnosis not present

## 2023-02-04 DIAGNOSIS — G44211 Episodic tension-type headache, intractable: Secondary | ICD-10-CM | POA: Diagnosis not present

## 2023-02-04 DIAGNOSIS — M5136 Other intervertebral disc degeneration, lumbar region: Secondary | ICD-10-CM | POA: Diagnosis not present

## 2023-02-04 DIAGNOSIS — Z79891 Long term (current) use of opiate analgesic: Secondary | ICD-10-CM | POA: Diagnosis not present

## 2023-02-04 DIAGNOSIS — E1143 Type 2 diabetes mellitus with diabetic autonomic (poly)neuropathy: Secondary | ICD-10-CM | POA: Diagnosis not present

## 2023-02-04 DIAGNOSIS — G629 Polyneuropathy, unspecified: Secondary | ICD-10-CM | POA: Diagnosis not present

## 2023-02-04 DIAGNOSIS — M79671 Pain in right foot: Secondary | ICD-10-CM | POA: Diagnosis not present

## 2023-02-04 DIAGNOSIS — M545 Low back pain, unspecified: Secondary | ICD-10-CM | POA: Diagnosis not present

## 2023-02-15 ENCOUNTER — Other Ambulatory Visit: Payer: Self-pay | Admitting: Family Medicine

## 2023-03-10 ENCOUNTER — Other Ambulatory Visit: Payer: Self-pay | Admitting: Family Medicine

## 2023-04-01 ENCOUNTER — Other Ambulatory Visit: Payer: Self-pay | Admitting: Family Medicine

## 2023-04-01 NOTE — Telephone Encounter (Signed)
Erroneous encounter. Please disregard.

## 2023-04-01 NOTE — Telephone Encounter (Signed)
Patient will need an office visit for further refills. Requested Prescriptions  Pending Prescriptions Disp Refills   VENTOLIN HFA 108 (90 Base) MCG/ACT inhaler [Pharmacy Med Name: Ventolin HFA 108 (90 Base) MCG/ACT Inhalation Aerosol Solution] 18 g 2    Sig: INHALE 2 PUFFS BY MOUTH EVERY 6 HOURS AS NEEDED FOR WHEEZING FOR SHORTNESS OF BREATH     Pulmonology:  Beta [facility]on[facility] 2 Passed - 04/01/2023  6:51 AM      Passed - Last BP in normal range    BP Readings from Last 1 Encounters:  12/24/22 126/74         Passed - Last Heart Rate in normal range    Pulse Readings from Last 1 Encounters:  12/24/22 93         Passed - Valid encounter within last 12 months    Recent Outpatient Visits           10 months ago Chronic obstructive pulmonary disease, unspecified COPD type (HCC)   [facility] Pickard, Priscille Heidelberg, MD   11 months ago Dyspnea, unspecified type   [facility] Pickard, Priscille Heidelberg, MD   1 year ago Prediabetes   [facility] Tanya Nones, Priscille Heidelberg, MD   1 year ago Annual physical exam   [facility] Valentino Nose, NP   1 year ago Fatigue, unspecified type   [facility] Valentino Nose, NP

## 2023-04-07 ENCOUNTER — Telehealth: Payer: Self-pay

## 2023-04-07 NOTE — Telephone Encounter (Signed)
Pt's fiance called in stating that he tried to remove a tick from under pt's breast but he doesn't think that he was able to remove the head. Pt's fiance stated that the site is very red and irritated. No appt's were available today. Pt's fiance wanted to know if pt will need any antibiotic or lab work done.Please advise.  Cb#: [phone] Mayme Genta

## 2023-04-08 ENCOUNTER — Ambulatory Visit: Payer: 59 | Admitting: Family Medicine

## 2023-04-08 DIAGNOSIS — G629 Polyneuropathy, unspecified: Secondary | ICD-10-CM | POA: Diagnosis not present

## 2023-04-08 DIAGNOSIS — Z79891 Long term (current) use of opiate analgesic: Secondary | ICD-10-CM | POA: Diagnosis not present

## 2023-04-08 DIAGNOSIS — M79671 Pain in right foot: Secondary | ICD-10-CM | POA: Diagnosis not present

## 2023-04-08 DIAGNOSIS — M545 Low back pain, unspecified: Secondary | ICD-10-CM | POA: Diagnosis not present

## 2023-04-08 DIAGNOSIS — M5136 Other intervertebral disc degeneration, lumbar region: Secondary | ICD-10-CM | POA: Diagnosis not present

## 2023-04-08 DIAGNOSIS — G894 Chronic pain syndrome: Secondary | ICD-10-CM | POA: Diagnosis not present

## 2023-04-08 DIAGNOSIS — G44211 Episodic tension-type headache, intractable: Secondary | ICD-10-CM | POA: Diagnosis not present

## 2023-04-08 DIAGNOSIS — E1143 Type 2 diabetes mellitus with diabetic autonomic (poly)neuropathy: Secondary | ICD-10-CM | POA: Diagnosis not present

## 2023-04-14 ENCOUNTER — Other Ambulatory Visit: Payer: Self-pay | Admitting: Family Medicine

## 2023-04-14 NOTE — Telephone Encounter (Signed)
Unable to refill per protocol, Rx request is too soon. Last refill 12/24/22 for 90 and 3 refills.  Requested Prescriptions  Pending Prescriptions Disp Refills   hydrOXYzine (VISTARIL) 25 MG capsule [Pharmacy Med Name: hydrOXYzine Pamoate 25 MG Oral Capsule] 90 capsule 0    Sig: TAKE 1 CAPSULE BY MOUTH EVERY 8 HOURS AS NEEDED FOR ANXIETY     Ear, Nose, and Throat:  Antihistamines 2 Failed - 04/14/2023  6:50 AM      Failed - Cr in normal range and within 360 days    Creat  Date Value Ref Range Status  10/22/2022 1.17 (H) 0.50 - 1.05 mg/dL Final         Passed - Valid encounter within last 12 months    Recent Outpatient Visits           11 months ago Chronic obstructive pulmonary disease, unspecified COPD type (HCC)   [facility] Pickard, Priscille Heidelberg, MD   12 months ago Dyspnea, unspecified type   [facility] Pickard, Priscille Heidelberg, MD   1 year ago Prediabetes   [facility] Tanya Nones, Priscille Heidelberg, MD   1 year ago Annual physical exam   [facility] Valentino Nose, NP   1 year ago Fatigue, unspecified type   [facility] Valentino Nose, NP

## 2023-04-28 ENCOUNTER — Other Ambulatory Visit: Payer: Self-pay | Admitting: Family Medicine

## 2023-05-06 DIAGNOSIS — M5136 Other intervertebral disc degeneration, lumbar region: Secondary | ICD-10-CM | POA: Diagnosis not present

## 2023-05-06 DIAGNOSIS — G44211 Episodic tension-type headache, intractable: Secondary | ICD-10-CM | POA: Diagnosis not present

## 2023-05-06 DIAGNOSIS — E1143 Type 2 diabetes mellitus with diabetic autonomic (poly)neuropathy: Secondary | ICD-10-CM | POA: Diagnosis not present

## 2023-05-06 DIAGNOSIS — G894 Chronic pain syndrome: Secondary | ICD-10-CM | POA: Diagnosis not present

## 2023-05-06 DIAGNOSIS — G629 Polyneuropathy, unspecified: Secondary | ICD-10-CM | POA: Diagnosis not present

## 2023-05-06 DIAGNOSIS — Z79891 Long term (current) use of opiate analgesic: Secondary | ICD-10-CM | POA: Diagnosis not present

## 2023-05-06 DIAGNOSIS — M545 Low back pain, unspecified: Secondary | ICD-10-CM | POA: Diagnosis not present

## 2023-05-06 DIAGNOSIS — M79671 Pain in right foot: Secondary | ICD-10-CM | POA: Diagnosis not present

## 2023-05-13 ENCOUNTER — Ambulatory Visit: Payer: 59 | Admitting: Family Medicine

## 2023-05-17 ENCOUNTER — Ambulatory Visit: Payer: 59 | Admitting: Podiatry

## 2023-05-17 ENCOUNTER — Other Ambulatory Visit: Payer: Self-pay | Admitting: Family Medicine

## 2023-05-17 NOTE — Telephone Encounter (Signed)
Prescription Request  05/17/2023  LOV: 12/24/2022  What is the name of the medication or equipment? hydrOXYzine (VISTARIL) 25 MG capsule [[id number]]  Have you contacted your pharmacy to request a refill? Yes   Which pharmacy would you like this sent to?  [id number] - [place] ([place] - [address], [place] ([place] [address] Phone: [phone]  Fax: [phone]    Patient notified that their request is being sent to the clinical staff for review and that they should receive a response within 2 business days.   Please advise at [facility] [phone]

## 2023-05-25 ENCOUNTER — Encounter: Payer: Self-pay | Admitting: Family Medicine

## 2023-05-25 ENCOUNTER — Ambulatory Visit (INDEPENDENT_AMBULATORY_CARE_PROVIDER_SITE_OTHER): Payer: 59 | Admitting: Family Medicine

## 2023-05-25 VITALS — BP 150/88 | HR 76 | Temp 98.6°F | Ht 62.0 in | Wt 206.0 lb

## 2023-05-25 DIAGNOSIS — R7303 Prediabetes: Secondary | ICD-10-CM

## 2023-05-25 DIAGNOSIS — Z716 Tobacco abuse counseling: Secondary | ICD-10-CM | POA: Insufficient documentation

## 2023-05-25 DIAGNOSIS — I10 Essential (primary) hypertension: Secondary | ICD-10-CM

## 2023-05-25 DIAGNOSIS — F17210 Nicotine dependence, cigarettes, uncomplicated: Secondary | ICD-10-CM | POA: Diagnosis not present

## 2023-05-25 DIAGNOSIS — F411 Generalized anxiety disorder: Secondary | ICD-10-CM | POA: Diagnosis not present

## 2023-05-25 MED ORDER — HYDROXYZINE PAMOATE 50 MG PO CAPS: 50.0000 mg | ORAL_CAPSULE | Freq: Three times a day (TID) | ORAL | 0 refills | Status: DC | PRN

## 2023-05-25 MED ORDER — VARENICLINE TARTRATE (STARTER) 0.5 MG X 11 & 1 MG X 42 PO TBPK: ORAL_TABLET | ORAL | 0 refills | Status: DC

## 2023-05-25 NOTE — Assessment & Plan Note (Signed)
Uncontrolled. Will increase Hydroxyzine to 50mg  q8h PRN. Denies SI/HI. Stressors include her son's health.

## 2023-05-25 NOTE — Assessment & Plan Note (Signed)
Recheck A1c today 

## 2023-05-25 NOTE — Assessment & Plan Note (Signed)
Chronic uncontrolled. She is not sure if she is taking Amlodipine and stopped taking HCTZ due to urinary frequency. Will verify meds when she returns home today and notify office, needs to restart Amlodipine or increase to 10mg  daily. She also reports her BP elevations may be due to uncontrolled anxiety related to her son's health. Will increase Hydralazine to 50mg  q8h PRN. Seek medical care for chest pain, palpitations, shortness of breath, vision changes, recurrent headaches. CMP today. Follow up in 2 weeks.
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
## 2023-05-25 NOTE — Assessment & Plan Note (Signed)
3-5 minute discussion regarding the harms of tobacco use, the benefits of cessation, and methods of cessation. Discussed that there are medication options to help with cessation. Provided printed education on steps to quit smoking. Patient is willing to try a medication to help.I have reorder Chantix as she did not start this when previously prescribed.

## 2023-05-26 LAB — COMPLETE METABOLIC PANEL WITH GFR
AG Ratio: 1.3 (calc) (ref 1.0–2.5)
ALT: 13 U/L (ref 6–29)
AST: 14 U/L (ref 10–35)
Albumin: 4 g/dL (ref 3.6–5.1)
Alkaline phosphatase (APISO): 85 U/L (ref 37–153)
BUN/Creatinine Ratio: 14 (calc) (ref 6–22)
BUN: 15 mg/dL (ref 7–25)
CO2: 33 mmol/L — ABNORMAL HIGH (ref 20–32)
Calcium: 9 mg/dL (ref 8.6–10.4)
Chloride: 103 mmol/L (ref 98–110)
Creat: 1.06 mg/dL — ABNORMAL HIGH (ref 0.50–1.05)
Globulin: 3.1 g/dL (calc) (ref 1.9–3.7)
Glucose, Bld: 99 mg/dL (ref 65–99)
Potassium: 4.2 mmol/L (ref 3.5–5.3)
Sodium: 143 mmol/L (ref 135–146)
Total Bilirubin: 0.2 mg/dL (ref 0.2–1.2)
Total Protein: 7.1 g/dL (ref 6.1–8.1)
eGFR: 58 mL/min/{1.73_m2} — ABNORMAL LOW (ref >=60)

## 2023-05-26 LAB — LIPID PANEL
Cholesterol: 150 mg/dL (ref <200)
HDL: 47 mg/dL — ABNORMAL LOW (ref >=50)
LDL Cholesterol (Calc): 80 mg/dL (calc)
Non-HDL Cholesterol (Calc): 103 mg/dL (calc) (ref <130)
Total CHOL/HDL Ratio: 3.2 (calc) (ref <5.0)
Triglycerides: 131 mg/dL (ref <150)

## 2023-05-26 LAB — HEMOGLOBIN A1C
Hgb A1c MFr Bld: 6.5 % of total Hgb — ABNORMAL HIGH (ref <5.7)
Mean Plasma Glucose: 140 mg/dL
eAG (mmol/L): 7.7 mmol/L

## 2023-05-26 MED ORDER — AMLODIPINE BESYLATE 5 MG PO TABS: 5.0000 mg | ORAL_TABLET | Freq: Every day | ORAL | 1 refills | Status: DC

## 2023-05-26 NOTE — Addendum Note (Signed)
Addended by: Park Meo on: 05/26/2023 03:30 PM   Modules accepted: Orders

## 2023-05-30 ENCOUNTER — Ambulatory Visit: Payer: 59 | Admitting: Podiatry

## 2023-06-03 DIAGNOSIS — G44211 Episodic tension-type headache, intractable: Secondary | ICD-10-CM | POA: Diagnosis not present

## 2023-06-03 DIAGNOSIS — Z79891 Long term (current) use of opiate analgesic: Secondary | ICD-10-CM | POA: Diagnosis not present

## 2023-06-03 DIAGNOSIS — Z79899 Other long term (current) drug therapy: Secondary | ICD-10-CM | POA: Diagnosis not present

## 2023-06-03 DIAGNOSIS — E1143 Type 2 diabetes mellitus with diabetic autonomic (poly)neuropathy: Secondary | ICD-10-CM | POA: Diagnosis not present

## 2023-06-03 DIAGNOSIS — M79671 Pain in right foot: Secondary | ICD-10-CM | POA: Diagnosis not present

## 2023-06-03 DIAGNOSIS — G894 Chronic pain syndrome: Secondary | ICD-10-CM | POA: Diagnosis not present

## 2023-06-03 DIAGNOSIS — G629 Polyneuropathy, unspecified: Secondary | ICD-10-CM | POA: Diagnosis not present

## 2023-06-03 DIAGNOSIS — M545 Low back pain, unspecified: Secondary | ICD-10-CM | POA: Diagnosis not present

## 2023-06-03 DIAGNOSIS — M5136 Other intervertebral disc degeneration, lumbar region: Secondary | ICD-10-CM | POA: Diagnosis not present

## 2023-06-13 ENCOUNTER — Encounter: Payer: Self-pay | Admitting: Family Medicine

## 2023-06-13 ENCOUNTER — Other Ambulatory Visit: Payer: Self-pay | Admitting: Family Medicine

## 2023-06-13 ENCOUNTER — Ambulatory Visit (INDEPENDENT_AMBULATORY_CARE_PROVIDER_SITE_OTHER): Payer: 59 | Admitting: Family Medicine

## 2023-06-13 VITALS — BP 142/72 | HR 71 | Temp 97.8°F | Ht 62.0 in | Wt 193.0 lb

## 2023-06-13 DIAGNOSIS — I10 Essential (primary) hypertension: Secondary | ICD-10-CM | POA: Diagnosis not present

## 2023-06-13 DIAGNOSIS — Z23 Encounter for immunization: Secondary | ICD-10-CM

## 2023-06-13 MED ORDER — AMLODIPINE BESYLATE 10 MG PO TABS
10.0000 mg | ORAL_TABLET | Freq: Every day | ORAL | 3 refills | Status: DC
Start: 2023-06-13 — End: 2023-08-16

## 2023-06-13 MED ORDER — LOSARTAN POTASSIUM 100 MG PO TABS: 100.0000 mg | ORAL_TABLET | Freq: Every day | ORAL | 3 refills | Status: DC
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
## 2023-06-13 NOTE — Patient Instructions (Addendum)
Continue checking blood pressure at home in AM and PM and record readings Increase Amlodipine to 10mg  daily Continue Hydrochlorothiazide 12.5mg  daily and Losartan 100mg  daily May use Fexmid 7.5mg  up to 3 times daily for muscle spasms Please bring all medications, blood pressure cuff, and blood pressure readings to next visit

## 2023-06-13 NOTE — Assessment & Plan Note (Signed)
Chronic uncontrolled. Increase Amlodipine to 10mg  daily and continue hydrochlorothiazide 12.5mg  daily and Losartan 100mg  daily. Continue to monitor BP at home and record values. Seek medical care for chest pain, palpitations, shortness of breath, vision changes, recurrent headaches. CMP today. Follow up in 2 weeks.

## 2023-06-14 ENCOUNTER — Other Ambulatory Visit: Payer: Self-pay | Admitting: Family Medicine

## 2023-06-14 LAB — CBC WITH DIFFERENTIAL/PLATELET
Absolute Monocytes: 573 cells/uL (ref 200–950)
Basophils Absolute: 59 cells/uL (ref 0–200)
Basophils Relative: 0.5 %
Eosinophils Absolute: 176 cells/uL (ref 15–500)
Eosinophils Relative: 1.5 %
HCT: 50.9 % — ABNORMAL HIGH (ref 35.0–45.0)
Hemoglobin: 15.9 g/dL — ABNORMAL HIGH (ref 11.7–15.5)
Lymphs Abs: 2141 cells/uL (ref 850–3900)
MCH: 25.2 pg — ABNORMAL LOW (ref 27.0–33.0)
MCHC: 31.2 g/dL — ABNORMAL LOW (ref 32.0–36.0)
MCV: 80.7 fL (ref 80.0–100.0)
MPV: 11.3 fL (ref 7.5–12.5)
Monocytes Relative: 4.9 %
Neutro Abs: 8752 cells/uL — ABNORMAL HIGH (ref 1500–7800)
Neutrophils Relative %: 74.8 %
Platelets: 268 10*3/uL (ref 140–400)
RBC: 6.31 10*6/uL — ABNORMAL HIGH (ref 3.80–5.10)
RDW: 17.3 % — ABNORMAL HIGH (ref 11.0–15.0)
Total Lymphocyte: 18.3 %
WBC: 11.7 10*3/uL — ABNORMAL HIGH (ref 3.8–10.8)

## 2023-06-14 LAB — COMPLETE METABOLIC PANEL WITH GFR
AG Ratio: 1.4 (calc) (ref 1.0–2.5)
ALT: 14 U/L (ref 6–29)
AST: 14 U/L (ref 10–35)
Albumin: 4.4 g/dL (ref 3.6–5.1)
Alkaline phosphatase (APISO): 92 U/L (ref 37–153)
BUN/Creatinine Ratio: 17 (calc) (ref 6–22)
BUN: 18 mg/dL (ref 7–25)
CO2: 37 mmol/L — ABNORMAL HIGH (ref 20–32)
Calcium: 9.7 mg/dL (ref 8.6–10.4)
Chloride: 101 mmol/L (ref 98–110)
Creat: 1.08 mg/dL — ABNORMAL HIGH (ref 0.50–1.05)
Globulin: 3.2 g/dL (calc) (ref 1.9–3.7)
Glucose, Bld: 146 mg/dL — ABNORMAL HIGH (ref 65–99)
Potassium: 4.4 mmol/L (ref 3.5–5.3)
Sodium: 142 mmol/L (ref 135–146)
Total Bilirubin: 0.3 mg/dL (ref 0.2–1.2)
Total Protein: 7.6 g/dL (ref 6.1–8.1)
eGFR: 57 mL/min/{1.73_m2} — ABNORMAL LOW (ref >=60)

## 2023-06-14 NOTE — Telephone Encounter (Signed)
No longer current dosing of this medication  Requested Prescriptions  Pending Prescriptions Disp Refills   hydrOXYzine (VISTARIL) 25 MG capsule [Pharmacy Med Name: hydrOXYzine Pamoate 25 MG Oral Capsule] 90 capsule 0    Sig: TAKE 1 CAPSULE BY MOUTH EVERY 8 HOURS AS NEEDED FOR ANXIETY     Ear, Nose, and Throat:  Antihistamines 2 Failed - 06/14/2023  6:50 AM      Failed - Cr in normal range and within 360 days    Creat  Date Value Ref Range Status  06/13/2023 1.08 (H) 0.50 - 1.05 mg/dL Final         Failed - Valid encounter within last 12 months    Recent Outpatient Visits           1 year ago Chronic obstructive pulmonary disease, unspecified COPD type (HCC)   [facility] Pickard, Priscille Heidelberg, MD   1 year ago Dyspnea, unspecified type   [facility] Pickard, Priscille Heidelberg, MD   1 year ago Prediabetes   [facility] Tanya Nones, Priscille Heidelberg, MD   1 year ago Annual physical exam   [facility] Valentino Nose, NP   1 year ago Fatigue, unspecified type   [facility] Valentino Nose, NP       Future Appointments             In 2 weeks Tanya Nones, Priscille Heidelberg, MD [facility]

## 2023-07-04 ENCOUNTER — Ambulatory Visit (INDEPENDENT_AMBULATORY_CARE_PROVIDER_SITE_OTHER): Payer: 59 | Admitting: Family Medicine

## 2023-07-04 ENCOUNTER — Encounter: Payer: Self-pay | Admitting: Family Medicine

## 2023-07-04 VITALS — BP 130/78 | HR 94 | Temp 98.6°F | Ht 62.0 in | Wt 192.0 lb

## 2023-07-04 DIAGNOSIS — I10 Essential (primary) hypertension: Secondary | ICD-10-CM | POA: Diagnosis not present

## 2023-07-04 MED ORDER — HYDROXYZINE PAMOATE 50 MG PO CAPS
50.0000 mg | ORAL_CAPSULE | Freq: Three times a day (TID) | ORAL | 3 refills | Status: DC | PRN
Start: 2023-07-04 — End: 2023-07-04

## 2023-07-04 MED ORDER — HYDROXYZINE PAMOATE 50 MG PO CAPS: 50.0000 mg | ORAL_CAPSULE | Freq: Three times a day (TID) | ORAL | 3 refills | Status: DC | PRN
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
## 2023-07-06 ENCOUNTER — Other Ambulatory Visit: Payer: Self-pay | Admitting: Family Medicine

## 2023-07-06 DIAGNOSIS — Z1231 Encounter for screening mammogram for malignant neoplasm of breast: Secondary | ICD-10-CM

## 2023-07-08 ENCOUNTER — Other Ambulatory Visit: Payer: Self-pay | Admitting: Family Medicine

## 2023-07-08 DIAGNOSIS — M5136 Other intervertebral disc degeneration, lumbar region: Secondary | ICD-10-CM | POA: Diagnosis not present

## 2023-07-08 DIAGNOSIS — Z79891 Long term (current) use of opiate analgesic: Secondary | ICD-10-CM | POA: Diagnosis not present

## 2023-07-08 DIAGNOSIS — E1143 Type 2 diabetes mellitus with diabetic autonomic (poly)neuropathy: Secondary | ICD-10-CM | POA: Diagnosis not present

## 2023-07-08 DIAGNOSIS — M79671 Pain in right foot: Secondary | ICD-10-CM | POA: Diagnosis not present

## 2023-07-08 DIAGNOSIS — G629 Polyneuropathy, unspecified: Secondary | ICD-10-CM | POA: Diagnosis not present

## 2023-07-08 DIAGNOSIS — G44211 Episodic tension-type headache, intractable: Secondary | ICD-10-CM | POA: Diagnosis not present

## 2023-07-08 DIAGNOSIS — M545 Low back pain, unspecified: Secondary | ICD-10-CM | POA: Diagnosis not present

## 2023-07-08 DIAGNOSIS — Z79899 Other long term (current) drug therapy: Secondary | ICD-10-CM | POA: Diagnosis not present

## 2023-07-08 DIAGNOSIS — G894 Chronic pain syndrome: Secondary | ICD-10-CM | POA: Diagnosis not present

## 2023-08-03 ENCOUNTER — Ambulatory Visit: Admission: RE | Admit: 2023-08-03 | Payer: 59 | Source: Ambulatory Visit

## 2023-08-03 DIAGNOSIS — Z1231 Encounter for screening mammogram for malignant neoplasm of breast: Secondary | ICD-10-CM

## 2023-08-05 DIAGNOSIS — M5136 Other intervertebral disc degeneration, lumbar region: Secondary | ICD-10-CM | POA: Diagnosis not present

## 2023-08-05 DIAGNOSIS — G629 Polyneuropathy, unspecified: Secondary | ICD-10-CM | POA: Diagnosis not present

## 2023-08-05 DIAGNOSIS — G44211 Episodic tension-type headache, intractable: Secondary | ICD-10-CM | POA: Diagnosis not present

## 2023-08-05 DIAGNOSIS — M79672 Pain in left foot: Secondary | ICD-10-CM | POA: Diagnosis not present

## 2023-08-05 DIAGNOSIS — M545 Low back pain, unspecified: Secondary | ICD-10-CM | POA: Diagnosis not present

## 2023-08-05 DIAGNOSIS — G894 Chronic pain syndrome: Secondary | ICD-10-CM | POA: Diagnosis not present

## 2023-08-05 DIAGNOSIS — Z79891 Long term (current) use of opiate analgesic: Secondary | ICD-10-CM | POA: Diagnosis not present

## 2023-08-05 DIAGNOSIS — E1143 Type 2 diabetes mellitus with diabetic autonomic (poly)neuropathy: Secondary | ICD-10-CM | POA: Diagnosis not present

## 2023-08-05 DIAGNOSIS — M79671 Pain in right foot: Secondary | ICD-10-CM | POA: Diagnosis not present

## 2023-08-07 ENCOUNTER — Other Ambulatory Visit: Payer: Self-pay | Admitting: Family Medicine

## 2023-08-12 NOTE — Telephone Encounter (Signed)
Prescription Request  08/12/2023  LOV: 06/13/2023  What is the name of the medication or equipment?   Varenicline Tartrate, Starter, (CHANTIX STARTING MONTH PAK) 0.5 MG X 11 & 1 MG X 42 TBPK [[id number]]  DISCONTINUED      Have you contacted your pharmacy to request a refill? Yes   Which pharmacy would you like this sent to?  [facility] [id number] [place], [place] - [address] [facility] [address] [place] [address] Phone: [phone] Fax: [phone]    Patient notified that their request is being sent to the clinical staff for review and that they should receive a response within 2 business days.   Please advise pharmacist.

## 2023-08-16 ENCOUNTER — Ambulatory Visit (INDEPENDENT_AMBULATORY_CARE_PROVIDER_SITE_OTHER): Payer: Medicare HMO | Admitting: Family Medicine

## 2023-08-16 ENCOUNTER — Encounter: Payer: Self-pay | Admitting: Family Medicine

## 2023-08-16 VITALS — BP 120/70 | HR 89 | Temp 98.0°F | Ht 62.0 in | Wt 208.0 lb

## 2023-08-16 DIAGNOSIS — I10 Essential (primary) hypertension: Secondary | ICD-10-CM | POA: Diagnosis not present

## 2023-08-16 DIAGNOSIS — R600 Localized edema: Secondary | ICD-10-CM

## 2023-08-16 MED ORDER — HYDROCHLOROTHIAZIDE 12.5 MG PO CAPS
25.0000 mg | ORAL_CAPSULE | Freq: Every day | ORAL | 1 refills | Status: DC
Start: 2023-08-16 — End: 2023-11-21

## 2023-08-16 NOTE — Assessment & Plan Note (Signed)
Will obtain CMP and BNP. No signs of DVT on exam. Amlodipine was started 1.5 months ago, will discontinue this and increase hydrochlorothiazide to 25mg  daily. Encouraged to  monitor BP at home at least daily and return to office in 1 week. Seek immediate medical care for chest pain, shortness of breath.
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
## 2023-08-17 LAB — COMPLETE METABOLIC PANEL WITH GFR
AG Ratio: 1.4 (calc) (ref 1.0–2.5)
ALT: 18 U/L (ref 6–29)
AST: 16 U/L (ref 10–35)
Albumin: 3.6 g/dL (ref 3.6–5.1)
Alkaline phosphatase (APISO): 81 U/L (ref 37–153)
BUN: 24 mg/dL (ref 7–25)
CO2: 40 mmol/L — ABNORMAL HIGH (ref 20–32)
Calcium: 8.6 mg/dL (ref 8.6–10.4)
Chloride: 98 mmol/L (ref 98–110)
Creat: 1.04 mg/dL (ref 0.50–1.05)
Globulin: 2.6 g/dL (calc) (ref 1.9–3.7)
Glucose, Bld: 124 mg/dL — ABNORMAL HIGH (ref 65–99)
Potassium: 4.4 mmol/L (ref 3.5–5.3)
Sodium: 141 mmol/L (ref 135–146)
Total Bilirubin: 0.2 mg/dL (ref 0.2–1.2)
Total Protein: 6.2 g/dL (ref 6.1–8.1)
eGFR: 60 mL/min/{1.73_m2} (ref >=60)

## 2023-08-17 LAB — BRAIN NATRIURETIC PEPTIDE: Brain Natriuretic Peptide: 47 pg/mL (ref <100)

## 2023-08-23 ENCOUNTER — Encounter: Payer: Self-pay | Admitting: Family Medicine

## 2023-08-23 ENCOUNTER — Ambulatory Visit (INDEPENDENT_AMBULATORY_CARE_PROVIDER_SITE_OTHER): Payer: Medicare HMO | Admitting: Family Medicine

## 2023-08-23 ENCOUNTER — Ambulatory Visit
Admission: RE | Admit: 2023-08-23 | Discharge: 2023-08-23 | Disposition: A | Discharge status: Home / Self Care | Payer: Medicare HMO | Source: Ambulatory Visit | Attending: Family Medicine | Admitting: Family Medicine

## 2023-08-23 VITALS — BP 116/62 | HR 92 | Temp 98.4°F | Ht 62.0 in | Wt 200.0 lb

## 2023-08-23 DIAGNOSIS — R0602 Shortness of breath: Secondary | ICD-10-CM

## 2023-08-23 MED ORDER — SEMAGLUTIDE(0.25 OR 0.5MG/DOS) 2 MG/3ML ~~LOC~~ SOPN: 0.5000 mg | PEN_INJECTOR | SUBCUTANEOUS | 1 refills | Status: DC

## 2023-08-23 MED ORDER — FUROSEMIDE 40 MG PO TABS: 40.0000 mg | ORAL_TABLET | Freq: Every day | ORAL | 3 refills | Status: DC
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
## 2023-08-24 DIAGNOSIS — H35373 Puckering of macula, bilateral: Secondary | ICD-10-CM | POA: Diagnosis not present

## 2023-08-24 LAB — BASIC METABOLIC PANEL WITH GFR
BUN/Creatinine Ratio: 17 (calc) (ref 6–22)
BUN: 19 mg/dL (ref 7–25)
CO2: 30 mmol/L (ref 20–32)
Calcium: 8.7 mg/dL (ref 8.6–10.4)
Chloride: 103 mmol/L (ref 98–110)
Creat: 1.15 mg/dL — ABNORMAL HIGH (ref 0.50–1.05)
Glucose, Bld: 98 mg/dL (ref 65–99)
Potassium: 4.3 mmol/L (ref 3.5–5.3)
Sodium: 142 mmol/L (ref 135–146)
eGFR: 53 mL/min/{1.73_m2} — ABNORMAL LOW (ref >=60)

## 2023-08-24 LAB — BRAIN NATRIURETIC PEPTIDE: Brain Natriuretic Peptide: 27 pg/mL (ref <100)

## 2023-08-26 NOTE — Progress Notes (Signed)
LVM 08/26/23

## 2023-08-30 ENCOUNTER — Ambulatory Visit (INDEPENDENT_AMBULATORY_CARE_PROVIDER_SITE_OTHER): Payer: Medicare HMO | Admitting: Family Medicine

## 2023-08-30 ENCOUNTER — Encounter: Payer: Self-pay | Admitting: Family Medicine

## 2023-08-30 VITALS — BP 142/92 | HR 82 | Temp 98.9°F | Ht 62.0 in | Wt 200.0 lb

## 2023-08-30 DIAGNOSIS — R600 Localized edema: Secondary | ICD-10-CM | POA: Diagnosis not present

## 2023-08-30 MED ORDER — WEGOVY 0.5 MG/0.5ML ~~LOC~~ SOAJ: 0.5000 mg | SUBCUTANEOUS | 1 refills | Status: DC
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
## 2023-08-31 ENCOUNTER — Telehealth: Payer: Self-pay

## 2023-08-31 DIAGNOSIS — Z6836 Body mass index (BMI) 36.0-36.9, adult: Secondary | ICD-10-CM | POA: Insufficient documentation

## 2023-08-31 NOTE — Telephone Encounter (Signed)
PA FOR OZEMPIC:  Makayla Lin (Key: [id number])  Your information has been sent to [facility].

## 2023-09-02 NOTE — Telephone Encounter (Signed)
Your prior authorization for Reginal Lutes has been approved! More Info Personalized support and financial assistance may be available through the [employer] program. For more information, and to see program requirements, click on the More Info button to the right.

## 2023-09-06 DIAGNOSIS — G894 Chronic pain syndrome: Secondary | ICD-10-CM | POA: Diagnosis not present

## 2023-09-06 DIAGNOSIS — G629 Polyneuropathy, unspecified: Secondary | ICD-10-CM | POA: Diagnosis not present

## 2023-09-06 DIAGNOSIS — Z79891 Long term (current) use of opiate analgesic: Secondary | ICD-10-CM | POA: Diagnosis not present

## 2023-09-06 DIAGNOSIS — G44211 Episodic tension-type headache, intractable: Secondary | ICD-10-CM | POA: Diagnosis not present

## 2023-09-06 DIAGNOSIS — M545 Low back pain, unspecified: Secondary | ICD-10-CM | POA: Diagnosis not present

## 2023-09-06 DIAGNOSIS — E1143 Type 2 diabetes mellitus with diabetic autonomic (poly)neuropathy: Secondary | ICD-10-CM | POA: Diagnosis not present

## 2023-09-06 DIAGNOSIS — M79671 Pain in right foot: Secondary | ICD-10-CM | POA: Diagnosis not present

## 2023-09-06 DIAGNOSIS — M5136 Other intervertebral disc degeneration, lumbar region: Secondary | ICD-10-CM | POA: Diagnosis not present

## 2023-09-13 DIAGNOSIS — F331 Major depressive disorder, recurrent, moderate: Secondary | ICD-10-CM | POA: Diagnosis not present

## 2023-09-22 ENCOUNTER — Other Ambulatory Visit: Payer: Self-pay | Admitting: Family Medicine

## 2023-09-27 ENCOUNTER — Other Ambulatory Visit: Payer: Self-pay | Admitting: Family Medicine

## 2023-09-27 DIAGNOSIS — G6289 Other specified polyneuropathies: Secondary | ICD-10-CM

## 2023-09-27 NOTE — Telephone Encounter (Signed)
Requested medications are due for refill today.  yes  Requested medications are on the active medications list.  yes  Last refill. 12/24/2022 #180 1 rf  Future visit scheduled.   yes  Notes to clinic.  Refill not delegated.    Requested Prescriptions  Pending Prescriptions Disp Refills   pregabalin (LYRICA) 100 MG capsule [Pharmacy Med Name: Pregabalin 100 MG Oral Capsule] 180 capsule 0    Sig: TAKE 1 CAPSULE BY MOUTH TWICE DAILY AS NEEDED.     Not Delegated - Neurology:  Anticonvulsants - Controlled - pregabalin Failed - 09/27/2023 11:47 AM      Failed - This refill cannot be delegated      Failed - Cr in normal range and within 360 days    Creat  Date Value Ref Range Status  08/23/2023 1.15 (H) 0.50 - 1.05 mg/dL Final         Failed - Valid encounter within last 12 months    Recent Outpatient Visits           1 year ago Chronic obstructive pulmonary disease, unspecified COPD type (HCC)   [facility] Pickard, Priscille Heidelberg, MD   1 year ago Dyspnea, unspecified type   [facility] Pickard, Priscille Heidelberg, MD   1 year ago Prediabetes   [facility] Tanya Nones, Priscille Heidelberg, MD   1 year ago Annual physical exam   [facility] Valentino Nose, NP   2 years ago Fatigue, unspecified type   [facility] Valentino Nose, NP       Future Appointments             In 2 days Pickard, Priscille Heidelberg, MD [facility]            Passed - Completed PHQ-2 or PHQ-9 in the last 360 days

## 2023-09-29 ENCOUNTER — Encounter: Payer: Self-pay | Admitting: Family Medicine

## 2023-09-29 ENCOUNTER — Ambulatory Visit: Payer: Medicare HMO | Admitting: Family Medicine

## 2023-09-29 VITALS — BP 126/76 | HR 96 | Temp 97.5°F | Ht 62.0 in | Wt 200.0 lb

## 2023-09-29 DIAGNOSIS — G6289 Other specified polyneuropathies: Secondary | ICD-10-CM

## 2023-09-29 DIAGNOSIS — R600 Localized edema: Secondary | ICD-10-CM

## 2023-09-29 DIAGNOSIS — J449 Chronic obstructive pulmonary disease, unspecified: Secondary | ICD-10-CM | POA: Diagnosis not present

## 2023-09-29 MED ORDER — PREGABALIN 100 MG PO CAPS
ORAL_CAPSULE | ORAL | 1 refills | Status: DC
Start: 2023-09-29 — End: 2023-12-14

## 2023-09-29 MED ORDER — WEGOVY 1 MG/0.5ML ~~LOC~~ SOAJ: 1.0000 mg | SUBCUTANEOUS | 1 refills | Status: DC
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
## 2023-10-04 DIAGNOSIS — G629 Polyneuropathy, unspecified: Secondary | ICD-10-CM | POA: Diagnosis not present

## 2023-10-04 DIAGNOSIS — E1143 Type 2 diabetes mellitus with diabetic autonomic (poly)neuropathy: Secondary | ICD-10-CM | POA: Diagnosis not present

## 2023-10-04 DIAGNOSIS — M545 Low back pain, unspecified: Secondary | ICD-10-CM | POA: Diagnosis not present

## 2023-10-04 DIAGNOSIS — G894 Chronic pain syndrome: Secondary | ICD-10-CM | POA: Diagnosis not present

## 2023-10-04 DIAGNOSIS — M79671 Pain in right foot: Secondary | ICD-10-CM | POA: Diagnosis not present

## 2023-10-04 DIAGNOSIS — G44211 Episodic tension-type headache, intractable: Secondary | ICD-10-CM | POA: Diagnosis not present

## 2023-10-04 DIAGNOSIS — M5136 Other intervertebral disc degeneration, lumbar region with discogenic back pain only: Secondary | ICD-10-CM | POA: Diagnosis not present

## 2023-10-04 DIAGNOSIS — Z79891 Long term (current) use of opiate analgesic: Secondary | ICD-10-CM | POA: Diagnosis not present

## 2023-10-26 ENCOUNTER — Other Ambulatory Visit: Payer: Self-pay | Admitting: Family Medicine

## 2023-10-26 NOTE — Telephone Encounter (Signed)
Refused Ozempic 0.25 mg because it was discontinued on 08/30/2023 by Dr. Tanya Nones.   Pt. On Wegovy now.

## 2023-10-27 ENCOUNTER — Other Ambulatory Visit: Payer: Self-pay | Admitting: Family Medicine

## 2023-10-27 NOTE — Telephone Encounter (Signed)
Refused the Ozempic 0.25 mg because it was discontinued 08/30/2023.   Pt. Noted to be on Wegovy now.  Requested Prescriptions  Pending Prescriptions Disp Refills   OZEMPIC, 0.25 OR 0.5 MG/DOSE, 2 MG/3ML SOPN [Pharmacy Med Name: OZEMPIC 0.25-0.5 MG/DOSE(2 MG/3 ML)] 3 mL 1    Sig: DIAL AND INJECT UNDER THE SKIN 0.5 MG WEEKLY     Endocrinology:  Diabetes - GLP-1 Receptor Agonists - semaglutide Failed - 10/27/2023  6:10 AM      Failed - HBA1C in normal range and within 180 days    Hgb A1c MFr Bld  Date Value Ref Range Status  05/25/2023 6.5 (H) <5.7 % of total Hgb Final    Comment:    For someone without known diabetes, a hemoglobin A1c value of 6.5% or greater indicates that they may have  diabetes and this should be confirmed with a follow-up  test. . For someone with known diabetes, a value <7% indicates  that their diabetes is well controlled and a value  greater than or equal to 7% indicates suboptimal  control. A1c targets should be individualized based on  duration of diabetes, age, comorbid conditions, and  other considerations. . Currently, no consensus exists regarding use of hemoglobin A1c for diagnosis of diabetes for children. .          Failed - Cr in normal range and within 360 days    Creat  Date Value Ref Range Status  08/23/2023 1.15 (H) 0.50 - 1.05 mg/dL Final         Failed - Valid encounter within last 6 months    Recent Outpatient Visits           1 year ago Chronic obstructive pulmonary disease, unspecified COPD type (HCC)   [facility] Pickard, Priscille Heidelberg, MD   1 year ago Dyspnea, unspecified type   [facility] Donita Brooks, MD   1 year ago Prediabetes   [facility] Tanya Nones, Priscille Heidelberg, MD   1 year ago Annual physical exam   [facility] Valentino Nose, NP   2 years ago Fatigue, unspecified type   [facility] Valentino Nose, NP

## 2023-10-29 ENCOUNTER — Other Ambulatory Visit: Payer: Self-pay | Admitting: Family Medicine

## 2023-10-29 DIAGNOSIS — F411 Generalized anxiety disorder: Secondary | ICD-10-CM

## 2023-10-31 ENCOUNTER — Other Ambulatory Visit: Payer: Self-pay | Admitting: Family Medicine

## 2023-10-31 NOTE — Telephone Encounter (Signed)
Requested Prescriptions  Pending Prescriptions Disp Refills   citalopram (CELEXA) 40 MG tablet [Pharmacy Med Name: Citalopram Hydrobromide 40 MG Oral Tablet] 90 tablet 0    Sig: Take 1 tablet by mouth once daily     Psychiatry:  Antidepressants - SSRI Failed - 10/29/2023 12:39 PM      Failed - Valid encounter within last 6 months    Recent Outpatient Visits           1 year ago Chronic obstructive pulmonary disease, unspecified COPD type (HCC)   [facility] Pickard, Priscille Heidelberg, MD   1 year ago Dyspnea, unspecified type   [facility] Donita Brooks, MD   1 year ago Prediabetes   [facility] Tanya Nones, Priscille Heidelberg, MD   1 year ago Annual physical exam   [facility] Valentino Nose, NP   2 years ago Fatigue, unspecified type   [facility] Valentino Nose, NP              Passed - Completed PHQ-2 or PHQ-9 in the last 360 days

## 2023-11-01 NOTE — Telephone Encounter (Signed)
Unable to refill per protocol, Rx expired. discontinued 08/30/23, duplicate request.  Requested Prescriptions  Pending Prescriptions Disp Refills   OZEMPIC, 0.25 OR 0.5 MG/DOSE, 2 MG/3ML SOPN [Pharmacy Med Name: OZEMPIC 0.25-0.5 MG/DOSE(2 MG/3 ML)] 3 mL 1    Sig: DIAL AND INJECT UNDER THE SKIN 0.5 MG WEEKLY     Endocrinology:  Diabetes - GLP-1 Receptor Agonists - semaglutide Failed - 10/31/2023 11:04 AM      Failed - HBA1C in normal range and within 180 days    Hgb A1c MFr Bld  Date Value Ref Range Status  05/25/2023 6.5 (H) <5.7 % of total Hgb Final    Comment:    For someone without known diabetes, a hemoglobin A1c value of 6.5% or greater indicates that they may have  diabetes and this should be confirmed with a follow-up  test. . For someone with known diabetes, a value <7% indicates  that their diabetes is well controlled and a value  greater than or equal to 7% indicates suboptimal  control. A1c targets should be individualized based on  duration of diabetes, age, comorbid conditions, and  other considerations. . Currently, no consensus exists regarding use of hemoglobin A1c for diagnosis of diabetes for children. .          Failed - Cr in normal range and within 360 days    Creat  Date Value Ref Range Status  08/23/2023 1.15 (H) 0.50 - 1.05 mg/dL Final         Failed - Valid encounter within last 6 months    Recent Outpatient Visits           1 year ago Chronic obstructive pulmonary disease, unspecified COPD type (HCC)   [facility] Pickard, Priscille Heidelberg, MD   1 year ago Dyspnea, unspecified type   [facility] Donita Brooks, MD   1 year ago Prediabetes   [facility] Tanya Nones, Priscille Heidelberg, MD   1 year ago Annual physical exam   [facility] Valentino Nose, NP   2 years ago Fatigue, unspecified type   [facility] Valentino Nose, NP

## 2023-11-02 ENCOUNTER — Telehealth: Payer: Self-pay

## 2023-11-02 NOTE — Telephone Encounter (Signed)
Deedra Ehrich (Key: [id number] [id number])  Your PA appeal has been sent to [facility]

## 2023-11-04 DIAGNOSIS — G894 Chronic pain syndrome: Secondary | ICD-10-CM | POA: Diagnosis not present

## 2023-11-04 DIAGNOSIS — G44211 Episodic tension-type headache, intractable: Secondary | ICD-10-CM | POA: Diagnosis not present

## 2023-11-04 DIAGNOSIS — G629 Polyneuropathy, unspecified: Secondary | ICD-10-CM | POA: Diagnosis not present

## 2023-11-04 DIAGNOSIS — Z79891 Long term (current) use of opiate analgesic: Secondary | ICD-10-CM | POA: Diagnosis not present

## 2023-11-04 DIAGNOSIS — M5136 Other intervertebral disc degeneration, lumbar region with discogenic back pain only: Secondary | ICD-10-CM | POA: Diagnosis not present

## 2023-11-04 DIAGNOSIS — E1143 Type 2 diabetes mellitus with diabetic autonomic (poly)neuropathy: Secondary | ICD-10-CM | POA: Diagnosis not present

## 2023-11-12 ENCOUNTER — Other Ambulatory Visit: Payer: Self-pay | Admitting: Family Medicine

## 2023-11-12 DIAGNOSIS — K219 Gastro-esophageal reflux disease without esophagitis: Secondary | ICD-10-CM

## 2023-11-12 DIAGNOSIS — R5383 Other fatigue: Secondary | ICD-10-CM

## 2023-11-14 NOTE — Telephone Encounter (Signed)
Requested Prescriptions  Pending Prescriptions Disp Refills   pantoprazole (PROTONIX) 40 MG tablet [Pharmacy Med Name: Pantoprazole Sodium 40 MG Oral Tablet Delayed Release] 90 tablet 3    Sig: Take 1 tablet by mouth once daily     Gastroenterology: Proton Pump Inhibitors Failed - 11/12/2023  6:50 AM      Failed - Valid encounter within last 12 months    Recent Outpatient Visits           1 year ago Chronic obstructive pulmonary disease, unspecified COPD type (HCC)   [facility] Pickard, Priscille Heidelberg, MD   1 year ago Dyspnea, unspecified type   [facility] Donita Brooks, MD   1 year ago Prediabetes   [facility] Tanya Nones, Priscille Heidelberg, MD   2 years ago Annual physical exam   [facility] Valentino Nose, NP   2 years ago Fatigue, unspecified type   [facility] Valentino Nose, NP

## 2023-11-17 ENCOUNTER — Other Ambulatory Visit: Payer: Self-pay | Admitting: Family Medicine

## 2023-11-17 DIAGNOSIS — I10 Essential (primary) hypertension: Secondary | ICD-10-CM

## 2023-11-21 ENCOUNTER — Telehealth: Payer: Self-pay

## 2023-11-21 ENCOUNTER — Other Ambulatory Visit: Payer: Self-pay

## 2023-11-21 ENCOUNTER — Telehealth: Payer: Self-pay | Admitting: Family Medicine

## 2023-11-21 DIAGNOSIS — I10 Essential (primary) hypertension: Secondary | ICD-10-CM

## 2023-11-21 MED ORDER — HYDROCHLOROTHIAZIDE 12.5 MG PO CAPS: 25.0000 mg | ORAL_CAPSULE | Freq: Every day | ORAL | 1 refills | Status: DC

## 2023-11-21 NOTE — Telephone Encounter (Signed)
Copied from CRM [id number]. Topic: Referral - Request for Referral >> Nov 17, 2023  3:59 PM Maxwell Marion wrote: Did the patient discuss referral with their provider in the last year? No (If No - schedule appointment) (If Yes - send message)  Appointment offered?   Type of order/referral and detailed reason for visit: Hip has been hurting for almost a month, can't lay on it for more than 15 minutes  Preference of office, provider, location: [facility] area  If referral order, have you been seen by this specialty before? No (If Yes, this issue or another issue? When? Where?  Can we respond through MyChart? Yes

## 2023-11-21 NOTE — Telephone Encounter (Signed)
Prescription Request  11/21/2023  LOV: 09/29/23  What is the name of the medication or equipment? hydrochlorothiazide (MICROZIDE) 12.5 MG capsule [[id number]]  Have you contacted your pharmacy to request a refill? Yes   Which pharmacy would you like this sent to?  [facility] [id number] [place], [place] - [address] [facility] [address] [place] [address] Phone: [phone] Fax: [phone]    Patient notified that their request is being sent to the clinical staff for review and that they should receive a response within 2 business days.   Please advise at [facility] [phone]

## 2023-11-30 ENCOUNTER — Telehealth: Payer: Self-pay | Admitting: Family Medicine

## 2023-11-30 NOTE — Telephone Encounter (Signed)
Received fax from pharmacy patient needs a prior auth for Semaglutide-Weight Management (WEGOVY) 1 MG/0.5ML SOAJ   KEY: [id number]

## 2023-12-02 DIAGNOSIS — G894 Chronic pain syndrome: Secondary | ICD-10-CM | POA: Diagnosis not present

## 2023-12-02 DIAGNOSIS — G44211 Episodic tension-type headache, intractable: Secondary | ICD-10-CM | POA: Diagnosis not present

## 2023-12-02 DIAGNOSIS — G629 Polyneuropathy, unspecified: Secondary | ICD-10-CM | POA: Diagnosis not present

## 2023-12-02 DIAGNOSIS — E1143 Type 2 diabetes mellitus with diabetic autonomic (poly)neuropathy: Secondary | ICD-10-CM | POA: Diagnosis not present

## 2023-12-02 DIAGNOSIS — Z79891 Long term (current) use of opiate analgesic: Secondary | ICD-10-CM | POA: Diagnosis not present

## 2023-12-02 DIAGNOSIS — M51369 Other intervertebral disc degeneration, lumbar region without mention of lumbar back pain or lower extremity pain: Secondary | ICD-10-CM | POA: Diagnosis not present

## 2023-12-05 ENCOUNTER — Other Ambulatory Visit: Payer: Self-pay

## 2023-12-05 DIAGNOSIS — R7303 Prediabetes: Secondary | ICD-10-CM

## 2023-12-05 DIAGNOSIS — Z6836 Body mass index (BMI) 36.0-36.9, adult: Secondary | ICD-10-CM

## 2023-12-05 DIAGNOSIS — G6289 Other specified polyneuropathies: Secondary | ICD-10-CM

## 2023-12-05 DIAGNOSIS — G629 Polyneuropathy, unspecified: Secondary | ICD-10-CM

## 2023-12-05 DIAGNOSIS — R5383 Other fatigue: Secondary | ICD-10-CM

## 2023-12-05 MED ORDER — SEMAGLUTIDE (1 MG/DOSE) 4 MG/3ML ~~LOC~~ SOPN
1.0000 mg | PEN_INJECTOR | SUBCUTANEOUS | 1 refills | Status: DC
Start: 2023-12-05 — End: 2024-02-06

## 2023-12-06 ENCOUNTER — Ambulatory Visit: Payer: Medicare HMO | Admitting: Family Medicine

## 2023-12-07 ENCOUNTER — Other Ambulatory Visit: Payer: Self-pay | Admitting: Family Medicine

## 2023-12-07 ENCOUNTER — Telehealth: Payer: Self-pay

## 2023-12-07 DIAGNOSIS — F411 Generalized anxiety disorder: Secondary | ICD-10-CM

## 2023-12-07 NOTE — Telephone Encounter (Signed)
Buspar is not on the patient's medication list. The Movantik was last refilled in May. I have sent the patient a message to clarify about the Buspar. Mjp,lpn  Copied from CRM [id number]. Topic: Clinical - Medication Refill >> Dec 07, 2023  1:04 PM Almira Coaster wrote: Most Recent Primary Care Visit:  Provider: Lynnea Ferrier T  Department: [facility]  Visit Type: OFFICE VISIT  Date: 09/29/2023  Medication: buspirone 30 MG & MOVANTIK 25 MG TABS tablet  Has the patient contacted their pharmacy? Yes; however, the Buspirone was a sample bottle provided by Dr. Tanya Nones.  (Agent: If no, request that the patient contact the pharmacy for the refill. If patient does not wish to contact the pharmacy document the reason why and proceed with request.) (Agent: If yes, when and what did the pharmacy advise?)  Is this the correct pharmacy for this prescription? Yes If no, delete pharmacy and type the correct one.  This is the patient's preferred pharmacy:  [facility] [id number] - [place], [place] - [address] [place] [address] Phone: [phone] Fax: [phone]     Has the prescription been filled recently? No  Is the patient out of the medication? Yes  Has the patient been seen for an appointment in the last year OR does the patient have an upcoming appointment? Yes  Can we respond through MyChart? No, Prefers phone call.  Agent: Please be advised that Rx refills may take up to 3 business days. We ask that you follow-up with your pharmacy.

## 2023-12-08 ENCOUNTER — Other Ambulatory Visit: Payer: Self-pay | Admitting: Family Medicine

## 2023-12-08 MED ORDER — NALOXEGOL OXALATE 25 MG PO TABS: 25.0000 mg | ORAL_TABLET | Freq: Every day | ORAL | 5 refills | Status: AC

## 2023-12-14 ENCOUNTER — Telehealth: Payer: Self-pay

## 2023-12-14 ENCOUNTER — Other Ambulatory Visit: Payer: Self-pay

## 2023-12-14 DIAGNOSIS — G6289 Other specified polyneuropathies: Secondary | ICD-10-CM

## 2023-12-14 MED ORDER — PREGABALIN 100 MG PO CAPS: ORAL_CAPSULE | ORAL | 1 refills | Status: DC

## 2023-12-14 NOTE — Telephone Encounter (Signed)
Fax received from pharmacy for refill on Pregabalin 100 mg.

## 2023-12-14 NOTE — Telephone Encounter (Signed)
Order faxed to [facility] as per fax, [phone]. Mjp,lpn

## 2023-12-15 ENCOUNTER — Other Ambulatory Visit: Payer: Self-pay | Admitting: Family Medicine

## 2023-12-15 DIAGNOSIS — G6289 Other specified polyneuropathies: Secondary | ICD-10-CM

## 2023-12-15 MED ORDER — PREGABALIN 100 MG PO CAPS: ORAL_CAPSULE | ORAL | 1 refills | Status: DC

## 2023-12-30 ENCOUNTER — Other Ambulatory Visit: Payer: Self-pay | Admitting: Family Medicine

## 2023-12-30 DIAGNOSIS — M5136 Other intervertebral disc degeneration, lumbar region with discogenic back pain only: Secondary | ICD-10-CM | POA: Diagnosis not present

## 2023-12-30 DIAGNOSIS — Z79891 Long term (current) use of opiate analgesic: Secondary | ICD-10-CM | POA: Diagnosis not present

## 2023-12-30 DIAGNOSIS — G44211 Episodic tension-type headache, intractable: Secondary | ICD-10-CM | POA: Diagnosis not present

## 2023-12-30 DIAGNOSIS — E1143 Type 2 diabetes mellitus with diabetic autonomic (poly)neuropathy: Secondary | ICD-10-CM | POA: Diagnosis not present

## 2023-12-30 DIAGNOSIS — G629 Polyneuropathy, unspecified: Secondary | ICD-10-CM | POA: Diagnosis not present

## 2023-12-30 DIAGNOSIS — G894 Chronic pain syndrome: Secondary | ICD-10-CM | POA: Diagnosis not present

## 2024-01-02 ENCOUNTER — Other Ambulatory Visit: Payer: Self-pay | Admitting: Family Medicine

## 2024-01-02 NOTE — Telephone Encounter (Signed)
 Discontinued on 08/30/23 by provider, will refuse this request.  Requested Prescriptions  Pending Prescriptions Disp Refills   furosemide  (LASIX ) 40 MG tablet [Pharmacy Med Name: FUROSEMIDE  40 MG TABLET] 30 tablet 3    Sig: TAKE 1 TABLET BY MOUTH DAILY     Cardiovascular:  Diuretics - Loop Failed - 01/02/2024  5:51 PM      Failed - Cr in normal range and within 180 days    Creat  Date Value Ref Range Status  08/23/2023 1.15 (H) 0.50 - 1.05 mg/dL Final         Failed - Mg Level in normal range and within 180 days    Magnesium  Date Value Ref Range Status  03/24/2021 2.2 1.5 - 2.5 mg/dL Final         Failed - Valid encounter within last 6 months    Recent Outpatient Visits           1 year ago Chronic obstructive pulmonary disease, unspecified COPD type (HCC)   [facility] Duanne Butler DASEN, MD   1 year ago Dyspnea, unspecified type   [facility] Pickard, Butler DASEN, MD   1 year ago Prediabetes   [facility] Duanne Butler DASEN, MD   2 years ago Annual physical exam   [facility] Chandra Harlene LABOR, NP   2 years ago Fatigue, unspecified type   [facility] Chandra Harlene LABOR, NP              Passed - K in normal range and within 180 days    Potassium  Date Value Ref Range Status  08/23/2023 4.3 3.5 - 5.3 mmol/L Final         Passed - Ca in normal range and within 180 days    Calcium  Date Value Ref Range Status  08/23/2023 8.7 8.6 - 10.4 mg/dL Final         Passed - Na in normal range and within 180 days    Sodium  Date Value Ref Range Status  08/23/2023 142 135 - 146 mmol/L Final         Passed - Cl in normal range and within 180 days    Chloride  Date Value Ref Range Status  08/23/2023 103 98 - 110 mmol/L Final         Passed - Last BP in normal range    BP Readings from Last 1 Encounters:  09/29/23 126/76

## 2024-01-03 DIAGNOSIS — F331 Major depressive disorder, recurrent, moderate: Secondary | ICD-10-CM | POA: Diagnosis not present

## 2024-01-13 NOTE — Telephone Encounter (Signed)
Copied from CRM [id number]. Topic: Referral - Request for Referral >> Jan 13, 2024 10:25 AM Hector Shade B wrote: Did the patient discuss referral with their provider in the last year? No (If No - schedule appointment) (If Yes - send message)  Appointment offered? No  Type of order/referral and detailed reason for visit: Patient is requesting a referral for a mammogram  Preference of office, provider, location:   If referral order, have you been seen by this specialty before? No (If Yes, this issue or another issue? When? Where?  Can we respond through MyChart? Yes    01/13/24 spoke w/pt and made appt w/Dr. A at 10am for breast exam due to soreness

## 2024-01-16 ENCOUNTER — Encounter: Payer: Self-pay | Admitting: Family Medicine

## 2024-01-16 ENCOUNTER — Ambulatory Visit (INDEPENDENT_AMBULATORY_CARE_PROVIDER_SITE_OTHER): Payer: Medicare HMO | Admitting: Family Medicine

## 2024-01-16 VITALS — BP 122/74 | HR 87 | Temp 98.4°F | Ht 62.0 in | Wt 182.4 lb

## 2024-01-16 DIAGNOSIS — F411 Generalized anxiety disorder: Secondary | ICD-10-CM

## 2024-01-16 DIAGNOSIS — J432 Centrilobular emphysema: Secondary | ICD-10-CM

## 2024-01-16 DIAGNOSIS — Z720 Tobacco use: Secondary | ICD-10-CM | POA: Diagnosis not present

## 2024-01-16 DIAGNOSIS — N644 Mastodynia: Secondary | ICD-10-CM

## 2024-01-16 MED ORDER — BUSPIRONE HCL 5 MG PO TABS: 5.0000 mg | ORAL_TABLET | Freq: Two times a day (BID) | ORAL | 0 refills | Status: DC
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
## 2024-01-24 ENCOUNTER — Ambulatory Visit
Admission: RE | Admit: 2024-01-24 | Discharge: 2024-01-24 | Disposition: A | Discharge status: Home / Self Care | Payer: Medicare HMO | Source: Ambulatory Visit | Attending: Family Medicine | Admitting: Family Medicine

## 2024-01-24 DIAGNOSIS — N644 Mastodynia: Secondary | ICD-10-CM

## 2024-01-25 ENCOUNTER — Encounter: Payer: Self-pay | Admitting: Family Medicine

## 2024-01-27 DIAGNOSIS — M545 Low back pain, unspecified: Secondary | ICD-10-CM | POA: Diagnosis not present

## 2024-01-27 DIAGNOSIS — E1143 Type 2 diabetes mellitus with diabetic autonomic (poly)neuropathy: Secondary | ICD-10-CM | POA: Diagnosis not present

## 2024-01-27 DIAGNOSIS — M79671 Pain in right foot: Secondary | ICD-10-CM | POA: Diagnosis not present

## 2024-01-27 DIAGNOSIS — Z79891 Long term (current) use of opiate analgesic: Secondary | ICD-10-CM | POA: Diagnosis not present

## 2024-01-27 DIAGNOSIS — G894 Chronic pain syndrome: Secondary | ICD-10-CM | POA: Diagnosis not present

## 2024-01-28 ENCOUNTER — Other Ambulatory Visit: Payer: Self-pay | Admitting: Family Medicine

## 2024-01-30 NOTE — Telephone Encounter (Signed)
Requested by interface surescripts. Medication discontinued 08/30/23.  Requested Prescriptions  Refused Prescriptions Disp Refills   furosemide (LASIX) 40 MG tablet [Pharmacy Med Name: FUROSEMIDE 40 MG TABLET] 30 tablet 3    Sig: TAKE 1 TABLET BY MOUTH DAILY     Cardiovascular:  Diuretics - Loop Failed - 01/30/2024  2:52 PM      Failed - Cr in normal range and within 180 days    Creat  Date Value Ref Range Status  08/23/2023 1.15 (H) 0.50 - 1.05 mg/dL Final         Failed - Mg Level in normal range and within 180 days    Magnesium  Date Value Ref Range Status  03/24/2021 2.2 1.5 - 2.5 mg/dL Final         Failed - Valid encounter within last 6 months    Recent Outpatient Visits           1 year ago Chronic obstructive pulmonary disease, unspecified COPD type (HCC)   [facility] Donita Brooks, MD   1 year ago Dyspnea, unspecified type   [facility] Pickard, Priscille Heidelberg, MD   1 year ago Prediabetes   [facility] Donita Brooks, MD   2 years ago Annual physical exam   [facility] Valentino Nose, NP   2 years ago Fatigue, unspecified type   [facility] Valentino Nose, NP              Passed - K in normal range and within 180 days    Potassium  Date Value Ref Range Status  08/23/2023 4.3 3.5 - 5.3 mmol/L Final         Passed - Ca in normal range and within 180 days    Calcium  Date Value Ref Range Status  08/23/2023 8.7 8.6 - 10.4 mg/dL Final         Passed - Na in normal range and within 180 days    Sodium  Date Value Ref Range Status  08/23/2023 142 135 - 146 mmol/L Final         Passed - Cl in normal range and within 180 days    Chloride  Date Value Ref Range Status  08/23/2023 103 98 - 110 mmol/L Final         Passed - Last BP in normal range    BP Readings from Last 1 Encounters:  01/16/24 122/74

## 2024-02-04 ENCOUNTER — Other Ambulatory Visit: Payer: Self-pay | Admitting: Family Medicine

## 2024-02-04 DIAGNOSIS — R7303 Prediabetes: Secondary | ICD-10-CM

## 2024-02-04 DIAGNOSIS — G629 Polyneuropathy, unspecified: Secondary | ICD-10-CM

## 2024-02-04 DIAGNOSIS — R5383 Other fatigue: Secondary | ICD-10-CM

## 2024-02-04 DIAGNOSIS — Z6836 Body mass index (BMI) 36.0-36.9, adult: Secondary | ICD-10-CM

## 2024-02-04 DIAGNOSIS — G6289 Other specified polyneuropathies: Secondary | ICD-10-CM

## 2024-02-24 DIAGNOSIS — M79671 Pain in right foot: Secondary | ICD-10-CM | POA: Diagnosis not present

## 2024-02-24 DIAGNOSIS — M545 Low back pain, unspecified: Secondary | ICD-10-CM | POA: Diagnosis not present

## 2024-02-24 DIAGNOSIS — Z79891 Long term (current) use of opiate analgesic: Secondary | ICD-10-CM | POA: Diagnosis not present

## 2024-02-24 DIAGNOSIS — G894 Chronic pain syndrome: Secondary | ICD-10-CM | POA: Diagnosis not present

## 2024-02-28 ENCOUNTER — Other Ambulatory Visit: Payer: Self-pay | Admitting: Family Medicine

## 2024-02-28 DIAGNOSIS — G6289 Other specified polyneuropathies: Secondary | ICD-10-CM

## 2024-02-28 DIAGNOSIS — Z6836 Body mass index (BMI) 36.0-36.9, adult: Secondary | ICD-10-CM

## 2024-02-28 DIAGNOSIS — R5383 Other fatigue: Secondary | ICD-10-CM

## 2024-02-28 DIAGNOSIS — G629 Polyneuropathy, unspecified: Secondary | ICD-10-CM

## 2024-02-28 DIAGNOSIS — R7303 Prediabetes: Secondary | ICD-10-CM

## 2024-02-29 ENCOUNTER — Telehealth: Payer: Self-pay | Admitting: Family Medicine

## 2024-02-29 ENCOUNTER — Telehealth: Payer: Self-pay

## 2024-02-29 NOTE — Telephone Encounter (Signed)
 Copied from CRM [id number]. Topic: Clinical - Medication Question >> Feb 29, 2024 10:18 AM Gery Pray wrote: Reason for CRM: Patient would like to know if Dr. Tanya Nones will prescribe her 3 of Ozempic because the new insurance will not cover the Ozempic medication. Pleaswe call patient at [phone]

## 2024-02-29 NOTE — Telephone Encounter (Signed)
 Patient called back request to speak to provider. States she has not received a call. Advised message was left earlier. Called

## 2024-03-05 ENCOUNTER — Telehealth: Payer: Self-pay

## 2024-03-05 ENCOUNTER — Other Ambulatory Visit: Payer: Self-pay | Admitting: Family Medicine

## 2024-03-05 ENCOUNTER — Other Ambulatory Visit: Payer: Self-pay

## 2024-03-05 DIAGNOSIS — G6289 Other specified polyneuropathies: Secondary | ICD-10-CM

## 2024-03-05 DIAGNOSIS — Z6836 Body mass index (BMI) 36.0-36.9, adult: Secondary | ICD-10-CM

## 2024-03-05 DIAGNOSIS — R7303 Prediabetes: Secondary | ICD-10-CM

## 2024-03-05 DIAGNOSIS — F411 Generalized anxiety disorder: Secondary | ICD-10-CM

## 2024-03-05 DIAGNOSIS — R5383 Other fatigue: Secondary | ICD-10-CM

## 2024-03-05 DIAGNOSIS — G629 Polyneuropathy, unspecified: Secondary | ICD-10-CM

## 2024-03-05 MED ORDER — OZEMPIC (1 MG/DOSE) 4 MG/3ML ~~LOC~~ SOPN: 1.0000 mg | PEN_INJECTOR | SUBCUTANEOUS | 0 refills | Status: DC

## 2024-03-05 NOTE — Telephone Encounter (Signed)
 Pt called in to ask about prescription for ozempic, warm transferred pt to [facility]

## 2024-03-05 NOTE — Telephone Encounter (Signed)
 Copied from CRM [id number]. Topic: Clinical - Prescription Issue >> Mar 05, 2024 11:37 AM Haroldine Laws wrote: Reason for CRM: pt called saying Dr. Tanya Nones gave her samples of Buspar 30 mg but when Dr. Riley Nearing  called the medication in it was only for Buspar 5 mg.  She is saying she needs a new prescription for the 30 mg.  CB@  [phone]

## 2024-03-06 NOTE — Telephone Encounter (Signed)
 Requested medications are due for refill today.  yes  Requested medications are on the active medications list.  yes  Last refill. 12/07/2023 #90 0 rf  Future visit scheduled.   no  Notes to clinic.  Pt due for CPE.     Requested Prescriptions  Pending Prescriptions Disp Refills   citalopram (CELEXA) 40 MG tablet [Pharmacy Med Name: Citalopram Hydrobromide 40 MG Oral Tablet] 90 tablet 0    Sig: Take 1 tablet by mouth once daily     Psychiatry:  Antidepressants - SSRI Failed - 03/06/2024  1:50 PM      Failed - Valid encounter within last 6 months    Recent Outpatient Visits           1 year ago Chronic obstructive pulmonary disease, unspecified COPD type (HCC)   [facility] Pickard, Priscille Heidelberg, MD   1 year ago Dyspnea, unspecified type   [facility] Donita Brooks, MD   1 year ago Prediabetes   [facility] Tanya Nones, Priscille Heidelberg, MD   2 years ago Annual physical exam   [facility] Valentino Nose, NP   2 years ago Fatigue, unspecified type   [facility] Valentino Nose, NP              Passed - Completed PHQ-2 or PHQ-9 in the last 360 days

## 2024-03-09 ENCOUNTER — Other Ambulatory Visit: Payer: Self-pay | Admitting: Family Medicine

## 2024-03-09 DIAGNOSIS — I10 Essential (primary) hypertension: Secondary | ICD-10-CM

## 2024-03-09 NOTE — Telephone Encounter (Signed)
 Requested Prescriptions  Pending Prescriptions Disp Refills   hydrochlorothiazide (MICROZIDE) 12.5 MG capsule [Pharmacy Med Name: hydroCHLOROthiazide 12.5 MG CAPSULE] 90 capsule 1    Sig: TAKE 2 CAPSULES BY MOUTH DAILY     Cardiovascular: Diuretics - Thiazide Failed - 03/09/2024  3:17 PM      Failed - Cr in normal range and within 180 days    Creat  Date Value Ref Range Status  08/23/2023 1.15 (H) 0.50 - 1.05 mg/dL Final         Failed - K in normal range and within 180 days    Potassium  Date Value Ref Range Status  08/23/2023 4.3 3.5 - 5.3 mmol/L Final         Failed - Na in normal range and within 180 days    Sodium  Date Value Ref Range Status  08/23/2023 142 135 - 146 mmol/L Final         Failed - Valid encounter within last 6 months    Recent Outpatient Visits           1 year ago Chronic obstructive pulmonary disease, unspecified COPD type (HCC)   [facility] Pickard, Priscille Heidelberg, MD   1 year ago Dyspnea, unspecified type   [facility] Donita Brooks, MD   1 year ago Prediabetes   [facility] Tanya Nones, Priscille Heidelberg, MD   2 years ago Annual physical exam   [facility] Valentino Nose, NP   2 years ago Fatigue, unspecified type   [facility] Cathlean Marseilles A, NP              Passed - Last BP in normal range    BP Readings from Last 1 Encounters:  01/16/24 122/74

## 2024-03-13 ENCOUNTER — Telehealth: Payer: Self-pay

## 2024-03-13 NOTE — Telephone Encounter (Signed)
 Copied from CRM [id number]. Topic: Clinical - Medication Question >> Mar 13, 2024  2:14 PM Haroldine Laws wrote: Reason for CRM: pt is calling regarding her Ozempic.she is asking that Dr. Tanya Nones send to the pharmacy the Ozempic while her insurance will pay for it.  She is changing insurances April 1  CB#  [phone]

## 2024-03-23 ENCOUNTER — Telehealth: Payer: Self-pay

## 2024-03-23 ENCOUNTER — Other Ambulatory Visit: Payer: Self-pay

## 2024-03-23 DIAGNOSIS — R7303 Prediabetes: Secondary | ICD-10-CM

## 2024-03-23 DIAGNOSIS — G6289 Other specified polyneuropathies: Secondary | ICD-10-CM

## 2024-03-23 DIAGNOSIS — R5383 Other fatigue: Secondary | ICD-10-CM

## 2024-03-23 DIAGNOSIS — G629 Polyneuropathy, unspecified: Secondary | ICD-10-CM

## 2024-03-23 DIAGNOSIS — Z6836 Body mass index (BMI) 36.0-36.9, adult: Secondary | ICD-10-CM

## 2024-03-23 MED ORDER — OZEMPIC (1 MG/DOSE) 4 MG/3ML ~~LOC~~ SOPN
1.0000 mg | PEN_INJECTOR | SUBCUTANEOUS | 0 refills | Status: DC
Start: 2024-03-23 — End: 2024-07-19

## 2024-03-23 NOTE — Telephone Encounter (Signed)
 Copied from CRM [id number]. Topic: Clinical - Medication Question >> Mar 23, 2024 11:32 AM Fredrich Romans wrote: Reason for CRM: Patient would like to know if provider could send in 9 ml of ozempic for her?She needs it sent in before the 1st so that her insurance can pay for it.

## 2024-03-27 DIAGNOSIS — G894 Chronic pain syndrome: Secondary | ICD-10-CM | POA: Diagnosis not present

## 2024-03-27 DIAGNOSIS — M545 Low back pain, unspecified: Secondary | ICD-10-CM | POA: Diagnosis not present

## 2024-03-27 DIAGNOSIS — M79671 Pain in right foot: Secondary | ICD-10-CM | POA: Diagnosis not present

## 2024-03-27 DIAGNOSIS — Z79891 Long term (current) use of opiate analgesic: Secondary | ICD-10-CM | POA: Diagnosis not present

## 2024-03-27 DIAGNOSIS — G629 Polyneuropathy, unspecified: Secondary | ICD-10-CM | POA: Diagnosis not present

## 2024-04-24 DIAGNOSIS — Z79891 Long term (current) use of opiate analgesic: Secondary | ICD-10-CM | POA: Diagnosis not present

## 2024-04-24 DIAGNOSIS — M5136 Other intervertebral disc degeneration, lumbar region with discogenic back pain only: Secondary | ICD-10-CM | POA: Diagnosis not present

## 2024-04-24 DIAGNOSIS — G894 Chronic pain syndrome: Secondary | ICD-10-CM | POA: Diagnosis not present

## 2024-04-24 DIAGNOSIS — M545 Low back pain, unspecified: Secondary | ICD-10-CM | POA: Diagnosis not present

## 2024-04-27 ENCOUNTER — Other Ambulatory Visit: Payer: Self-pay | Admitting: Family Medicine

## 2024-04-27 DIAGNOSIS — I10 Essential (primary) hypertension: Secondary | ICD-10-CM

## 2024-05-17 ENCOUNTER — Ambulatory Visit: Payer: Self-pay | Admitting: *Deleted

## 2024-05-17 NOTE — Telephone Encounter (Signed)
 Chief Complaint: worsening panic attacks, severe and lasting hours  Symptoms: chest pain, SOB, shaking, nausea, overwhelmed anxious.  Frequency: on going for weeks  Pertinent Negatives: Patient denies difficulty breathing no sweating no plan to harm self or others Disposition: [x] ED /[] Urgent Care (no appt availability in office) / [] Appointment(In office/virtual)/ []  [facility]/ [] Home Care/ [] Refused Recommended Disposition /[] [facility]/ []  Follow-up with PCP Additional Notes:   Recommended ED multiple times and also recommended Urgent crisis center if patient will not go to ED. Gave address and phone # . Patient reports she [facility] not [facility]. Requesting to see PCP tomorrow. Reports son incarcerated and she is worried due to son just had 2nd leg amputation.  Unsure if patient will go to ED.  [facility] notified unsure patient will go to ED.     Copied from CRM [id number]. Topic: Clinical - Red Word Triage >> May 17, 2024  3:59 PM Lizabeth Riggs wrote: Red Word that prompted transfer to Nurse Triage:  Very depressed; Anxiety, Panic attacks and can't breath good with panic attacks. Reason for Disposition  Patient sounds very sick or weak to the triager  Answer Assessment - Initial Assessment Questions 1. CONCERN: "Did anything happen that prompted you to call today?"      Worsening panic attacks 2. ANXIETY SYMPTOMS: "Can you describe how you (your loved one; patient) have been feeling?" (e.g., tense, restless, panicky, anxious, keyed up, overwhelmed, sense of impending doom).      Overwhelmed , anxious  3. ONSET: "How long have you been feeling this way?" (e.g., hours, days, weeks)     Few weeks  4. SEVERITY: "How would you rate the level of anxiety?" (e.g., 0 - 10; or mild, moderate, severe).     severe 5. FUNCTIONAL IMPAIRMENT: "How have these feelings affected your ability to do daily activities?" "Have you had more difficulty than usual doing your  normal daily activities?" (e.g., getting better, same, worse; self-care, school, work, interactions)     Can do most daily activities but becomes very "shaky" and has to make self do activities 6. HISTORY: "Have you felt this way before?" "Have you ever been diagnosed with an anxiety problem in the past?" (e.g., generalized anxiety disorder, panic attacks, PTSD). If Yes, ask: "How was this problem treated?" (e.g., medicines, counseling, etc.)     Yes can not get appt with [facility] for "a while" 7. RISK OF HARM - SUICIDAL IDEATION: "Do you ever have thoughts of hurting or killing yourself?" If Yes, ask:  "Do you have these feelings now?" "Do you have a plan on how you would do this?"     Denies  8. TREATMENT:  "What has been done so far to treat this anxiety?" (e.g., medicines, relaxation strategies). "What has helped?"     Nothing helping  9. TREATMENT - THERAPIST: "Do you have a counselor or therapist? Name?"     Yes  10. POTENTIAL TRIGGERS: "Do you drink caffeinated beverages (e.g., coffee, colas, teas), and how much daily?" "Do you drink alcohol or use any drugs?" "Have you started any new medicines recently?"       na 11. PATIENT SUPPORT: "Who is with you now?" "Who do you live with?" "Do you have family or friends who you can talk to?"        family 12. OTHER SYMPTOMS: "Do you have any other symptoms?" (e.g., feeling depressed, trouble concentrating, trouble sleeping, trouble breathing, palpitations or fast heartbeat, chest  pain, sweating, nausea, or diarrhea)       Chest pain , fast heartbeat ,nausea, took ginger with minimal help . Depressed only sleeping 3 hours a night and takes trazodone , family dynamics with son that is incarcerated and just had 2nd leg amputation. 13. PREGNANCY: "Is there any chance you are pregnant?" "When was your last menstrual period?"       na  Protocols used: Anxiety and Panic Attack-A-AH

## 2024-05-22 DIAGNOSIS — G629 Polyneuropathy, unspecified: Secondary | ICD-10-CM | POA: Diagnosis not present

## 2024-05-22 DIAGNOSIS — M79671 Pain in right foot: Secondary | ICD-10-CM | POA: Diagnosis not present

## 2024-05-22 DIAGNOSIS — M545 Low back pain, unspecified: Secondary | ICD-10-CM | POA: Diagnosis not present

## 2024-05-22 DIAGNOSIS — G894 Chronic pain syndrome: Secondary | ICD-10-CM | POA: Diagnosis not present

## 2024-05-22 DIAGNOSIS — Z79891 Long term (current) use of opiate analgesic: Secondary | ICD-10-CM | POA: Diagnosis not present

## 2024-06-01 ENCOUNTER — Other Ambulatory Visit (HOSPITAL_COMMUNITY): Payer: Self-pay

## 2024-06-04 ENCOUNTER — Other Ambulatory Visit (HOSPITAL_COMMUNITY): Payer: Self-pay

## 2024-06-18 ENCOUNTER — Other Ambulatory Visit: Payer: Self-pay | Admitting: Family Medicine

## 2024-06-18 DIAGNOSIS — G6289 Other specified polyneuropathies: Secondary | ICD-10-CM

## 2024-06-19 DIAGNOSIS — M545 Low back pain, unspecified: Secondary | ICD-10-CM | POA: Diagnosis not present

## 2024-06-19 DIAGNOSIS — Z79891 Long term (current) use of opiate analgesic: Secondary | ICD-10-CM | POA: Diagnosis not present

## 2024-06-19 DIAGNOSIS — M5136 Other intervertebral disc degeneration, lumbar region with discogenic back pain only: Secondary | ICD-10-CM | POA: Diagnosis not present

## 2024-06-19 DIAGNOSIS — G894 Chronic pain syndrome: Secondary | ICD-10-CM | POA: Diagnosis not present

## 2024-06-19 DIAGNOSIS — M79671 Pain in right foot: Secondary | ICD-10-CM | POA: Diagnosis not present

## 2024-06-25 ENCOUNTER — Other Ambulatory Visit: Payer: Self-pay | Admitting: Family Medicine

## 2024-06-25 DIAGNOSIS — I10 Essential (primary) hypertension: Secondary | ICD-10-CM

## 2024-06-27 ENCOUNTER — Other Ambulatory Visit: Payer: Self-pay | Admitting: Family Medicine

## 2024-06-27 DIAGNOSIS — Z1231 Encounter for screening mammogram for malignant neoplasm of breast: Secondary | ICD-10-CM

## 2024-06-28 DIAGNOSIS — M175 Other unilateral secondary osteoarthritis of knee: Secondary | ICD-10-CM | POA: Diagnosis not present

## 2024-06-28 DIAGNOSIS — M4206 Juvenile osteochondrosis of spine, lumbar region: Secondary | ICD-10-CM | POA: Diagnosis not present

## 2024-07-03 NOTE — Telephone Encounter (Addendum)
 2nd request for losartan  (COZAAR ) 100 MG tablet and amlodipine  besylate 10 mg tab

## 2024-07-03 NOTE — Telephone Encounter (Signed)
 Requested Prescriptions  Pending Prescriptions Disp Refills   losartan  (COZAAR ) 100 MG tablet [Pharmacy Med Name: LOSARTAN  POTASSIUM 100 MG TAB] 90 tablet 0    Sig: TAKE 1 TABLET BY MOUTH DAILY     Cardiovascular:  Angiotensin Receptor Blockers Failed - 07/03/2024 10:27 AM      Failed - Cr in normal range and within 180 days    Creat  Date Value Ref Range Status  08/23/2023 1.15 (H) 0.50 - 1.05 mg/dL Final         Failed - K in normal range and within 180 days    Potassium  Date Value Ref Range Status  08/23/2023 4.3 3.5 - 5.3 mmol/L Final         Passed - Patient is not pregnant      Passed - Last BP in normal range    BP Readings from Last 1 Encounters:  01/16/24 122/74         Passed - Valid encounter within last 6 months    Recent Outpatient Visits           5 months ago Breast pain, left   [place] [facility] Aletha Bene, MD   9 months ago Bilateral lower extremity edema   [place] [facility] Duanne, Butler DASEN, MD   10 months ago Bilateral lower extremity edema   [place] [facility] Duanne, Butler DASEN, MD   10 months ago SOB (shortness of breath)   [place] [facility] Duanne, Butler DASEN, MD   10 months ago Bilateral lower extremity edema   [place] [facility] Kayla Jeoffrey RAMAN, FNP              Refused Prescriptions Disp Refills   amLODipine  (NORVASC ) 10 MG tablet [Pharmacy Med Name: amLODIPine  BESYLATE 10MG  TAB] 90 tablet 3    Sig: TAKE 1 TABLET BY MOUTH DAILY     Cardiovascular: Calcium Channel Blockers 2 Passed - 07/03/2024 10:27 AM      Passed - Last BP in normal range    BP Readings from Last 1 Encounters:  01/16/24 122/74         Passed - Last Heart Rate in normal range    Pulse Readings from Last 1 Encounters:  01/16/24 87         Passed - Valid encounter within last 6 months    Recent Outpatient Visits           5 months ago Breast pain,  left   [facility] Aletha Bene, MD   9 months ago Bilateral lower extremity edema   [place] [facility] Duanne, Butler DASEN, MD   10 months ago Bilateral lower extremity edema   [place] [facility] Duanne Butler DASEN, MD   10 months ago SOB (shortness of breath)   [place] [facility] Duanne, Butler DASEN, MD   10 months ago Bilateral lower extremity edema    [facility] Kayla Jeoffrey RAMAN, [place]

## 2024-07-03 NOTE — Telephone Encounter (Signed)
 Amlodipine  no longer on current medication list. Last OV 01/16/24- due to schedule- note on Rx Requested Prescriptions  Pending Prescriptions Disp Refills   losartan  (COZAAR ) 100 MG tablet [Pharmacy Med Name: LOSARTAN  POTASSIUM 100 MG TAB] 90 tablet 0    Sig: TAKE 1 TABLET BY MOUTH DAILY     Cardiovascular:  Angiotensin Receptor Blockers Failed - 07/03/2024 10:24 AM      Failed - Cr in normal range and within 180 days    Creat  Date Value Ref Range Status  08/23/2023 1.15 (H) 0.50 - 1.05 mg/dL Final         Failed - K in normal range and within 180 days    Potassium  Date Value Ref Range Status  08/23/2023 4.3 3.5 - 5.3 mmol/L Final         Passed - Patient is not pregnant      Passed - Last BP in normal range    BP Readings from Last 1 Encounters:  01/16/24 122/74         Passed - Valid encounter within last 6 months    Recent Outpatient Visits           5 months ago Breast pain, left   [place] [facility] Aletha Bene, MD   9 months ago Bilateral lower extremity edema   [place] [facility] Duanne, Butler DASEN, MD   10 months ago Bilateral lower extremity edema   [place] Duanne, Butler DASEN, MD   10 months ago SOB (shortness of breath)   [place] [facility] Duanne, Butler DASEN, MD   10 months ago Bilateral lower extremity edema   [place] [facility] Kayla Jeoffrey RAMAN, FNP              Refused Prescriptions Disp Refills   amLODipine  (NORVASC ) 10 MG tablet [Pharmacy Med Name: amLODIPine  BESYLATE 10MG  TAB] 90 tablet 3    Sig: TAKE 1 TABLET BY MOUTH DAILY     Cardiovascular: Calcium Channel Blockers 2 Passed - 07/03/2024 10:24 AM      Passed - Last BP in normal range    BP Readings from Last 1 Encounters:  01/16/24 122/74         Passed - Last Heart Rate in normal range    Pulse Readings from Last 1 Encounters:  01/16/24 87         Passed - Valid  encounter within last 6 months    Recent Outpatient Visits           5 months ago Breast pain, left   [place] [facility] Aletha Bene, MD   9 months ago Bilateral lower extremity edema   [place] [facility] Duanne, Butler DASEN, MD   10 months ago Bilateral lower extremity edema   [place] [facility] Duanne Butler DASEN, MD   10 months ago SOB (shortness of breath)   [place] [facility] Duanne, Butler DASEN, MD   10 months ago Bilateral lower extremity edema   [place] [facility] Kayla Jeoffrey RAMAN, [place]

## 2024-07-08 ENCOUNTER — Other Ambulatory Visit: Payer: Self-pay | Admitting: Family Medicine

## 2024-07-08 DIAGNOSIS — I10 Essential (primary) hypertension: Secondary | ICD-10-CM

## 2024-07-09 ENCOUNTER — Telehealth: Payer: Self-pay

## 2024-07-09 NOTE — Telephone Encounter (Signed)
 Hi Rock, would you or someone on your team be able to help Makayla Lin? Thank you!  Copied from CRM [id number]. Topic: Clinical - Medication Prior Auth >> Jul 09, 2024  3:24 PM Carlatta H wrote: Reason for CRM: Patients Semaglutide , 1 MG/DOSE, (OZEMPIC , 1 MG/DOSE,) 4 MG/3ML SOPN [[id number]] needs to be sent to insurance for prior authorization// Additional number for the patient if needed: [phone]

## 2024-07-10 ENCOUNTER — Other Ambulatory Visit (HOSPITAL_COMMUNITY): Payer: Self-pay

## 2024-07-10 ENCOUNTER — Telehealth: Payer: Self-pay | Admitting: Pharmacy Technician

## 2024-07-10 NOTE — Telephone Encounter (Signed)
 Ozempic Brena is approved exclusively as an adjunct to diet and exercise to improve glycemic  control in adults with type 2 diabetes mellitus. A review of patient's medical chart reveals no  documented diagnosis of type 2 diabetes or an A1C indicative of diabetes. Therefore, they do not  currently meet the criteria for prior authorization of this medication. If clinically appropriate, alternative  options such as Saxenda, Zepbound, or Wegovy  may be considered for this patient.   I need documentation to send to her plan showing she has a DX for type 2 diabetes mellitus.  Thanks!

## 2024-07-10 NOTE — Telephone Encounter (Signed)
 PA request has been Started. New Encounter has been or will be created for follow up. For additional info see Pharmacy Prior Auth telephone encounter from 07/10/2024.

## 2024-07-11 ENCOUNTER — Other Ambulatory Visit (HOSPITAL_COMMUNITY): Payer: Self-pay

## 2024-07-11 NOTE — Telephone Encounter (Signed)
 Pharmacy Patient Advocate Encounter  Received notification from [employer] that Prior Authorization for Ozempic  (1 MG/DOSE) 4MG /3ML pen-injectors has been APPROVED from 07/11/24 to 07/11/25. Unable to obtain price due to refill too soon rejection, last fill date 07/11/24 next available fill date09/17/25   PA #/Case ID/Reference #: [id number]

## 2024-07-12 ENCOUNTER — Ambulatory Visit: Admitting: Family Medicine

## 2024-07-17 DIAGNOSIS — G894 Chronic pain syndrome: Secondary | ICD-10-CM | POA: Diagnosis not present

## 2024-07-17 DIAGNOSIS — E1143 Type 2 diabetes mellitus with diabetic autonomic (poly)neuropathy: Secondary | ICD-10-CM | POA: Diagnosis not present

## 2024-07-17 DIAGNOSIS — Z79891 Long term (current) use of opiate analgesic: Secondary | ICD-10-CM | POA: Diagnosis not present

## 2024-07-17 DIAGNOSIS — M545 Low back pain, unspecified: Secondary | ICD-10-CM | POA: Diagnosis not present

## 2024-07-17 DIAGNOSIS — G8929 Other chronic pain: Secondary | ICD-10-CM | POA: Diagnosis not present

## 2024-07-18 ENCOUNTER — Other Ambulatory Visit: Payer: Self-pay | Admitting: Family Medicine

## 2024-07-18 DIAGNOSIS — G6289 Other specified polyneuropathies: Secondary | ICD-10-CM

## 2024-07-18 DIAGNOSIS — R7303 Prediabetes: Secondary | ICD-10-CM

## 2024-07-18 DIAGNOSIS — Z6836 Body mass index (BMI) 36.0-36.9, adult: Secondary | ICD-10-CM

## 2024-07-18 DIAGNOSIS — R5383 Other fatigue: Secondary | ICD-10-CM

## 2024-07-18 DIAGNOSIS — G629 Polyneuropathy, unspecified: Secondary | ICD-10-CM

## 2024-07-24 ENCOUNTER — Telehealth: Admitting: Physician Assistant

## 2024-07-24 DIAGNOSIS — G8929 Other chronic pain: Secondary | ICD-10-CM

## 2024-07-25 NOTE — Progress Notes (Signed)
  Because of ongoing back pain (> 4 weeks) and need for further assessment, I feel your condition warrants further evaluation and I recommend that you be seen in a face-to-face visit.   NOTE: There will be NO CHARGE for this E-Visit   If you are having a true medical emergency, please call 911.     For an urgent face to face visit, [facility] has multiple urgent care centers for your convenience.  Click the link below for the full list of locations and hours, walk-in wait times, appointment scheduling options and driving directions:  Urgent Care - [place], [place], [place], [place], [place], [place]  [place]     Your MyChart E-visit questionnaire answers were reviewed by a board certified advanced clinical practitioner to complete your personal care plan based on your specific symptoms.    Thank you for using e-Visits.

## 2024-07-30 ENCOUNTER — Other Ambulatory Visit: Payer: Self-pay | Admitting: Family Medicine

## 2024-07-30 DIAGNOSIS — G6289 Other specified polyneuropathies: Secondary | ICD-10-CM

## 2024-08-03 ENCOUNTER — Ambulatory Visit
Admission: RE | Admit: 2024-08-03 | Discharge: 2024-08-03 | Disposition: A | Discharge status: Home / Self Care | Source: Ambulatory Visit | Attending: Family Medicine | Admitting: Family Medicine

## 2024-08-03 DIAGNOSIS — Z1231 Encounter for screening mammogram for malignant neoplasm of breast: Secondary | ICD-10-CM | POA: Diagnosis not present

## 2024-08-09 ENCOUNTER — Telehealth: Payer: Self-pay

## 2024-08-09 NOTE — Telephone Encounter (Signed)
 Copied from CRM [id number]. Topic: General - Other >> Aug 09, 2024 11:55 AM Zebedee SAUNDERS wrote: Reason for CRM: Pt has panic attacks and would like something for it. Pt stated going out of town and can't come into office. Please call pt at [phone].

## 2024-08-15 ENCOUNTER — Ambulatory Visit

## 2024-08-15 VITALS — Ht 62.0 in | Wt 182.0 lb

## 2024-08-15 DIAGNOSIS — Z0000 Encounter for general adult medical examination without abnormal findings: Secondary | ICD-10-CM | POA: Diagnosis not present

## 2024-08-15 DIAGNOSIS — Z780 Asymptomatic menopausal state: Secondary | ICD-10-CM
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
## 2024-08-15 NOTE — Patient Instructions (Signed)
 Makayla Lin , Thank you for taking time out of your busy schedule to complete your Annual Wellness Visit with me. I enjoyed our conversation and look forward to speaking with you again next year. I, as well as your care team,  appreciate your ongoing commitment to your health goals. Please review the following plan we discussed and let me know if I can assist you in the future. Your Game plan/ To Do List    Referrals: You have an order for:  []   2D Mammogram  []   3D Mammogram  [x]   Bone Density     Please call for appointment:   [facility] [address] [place], [place] [address] [phone]    Make sure to wear two-piece clothing.  No lotions, powders, or deodorants the day of the appointment. Make sure to bring picture ID and insurance card.  Bring list of medications you are currently taking including any supplements.   Follow up Visits: We will see or speak with you next year for your Next Medicare AWV with our clinical staff Have you seen your provider in the last 6 months (3 months if uncontrolled diabetes)? No; scheduled for 08/24/24  Clinician Recommendations:  Aim for 30 minutes of exercise or brisk walking, 6-8 glasses of water, and 5 servings of fruits and vegetables each day.       This is a list of the screenings recommended for you:  Health Maintenance  Topic Date Due   COVID-19 Vaccine (1) Never done   DEXA scan (bone density measurement)  Never done   Pap Smear  11/13/2022   Flu Shot  07/27/2024   Medicare Annual Wellness Visit  08/15/2025   Colon Cancer Screening  01/01/2026   Mammogram  08/03/2026   DTaP/Tdap/Td vaccine (5 - Td or Tdap) 06/12/2033   Pneumococcal Vaccine for age over 60  Completed   Hepatitis C Screening  Completed   Zoster (Shingles) Vaccine  Completed   HPV Vaccine  Aged Out   Meningitis B Vaccine  Aged Out   Screening for Lung Cancer  Discontinued   Hepatitis B Vaccine  Discontinued    Advanced directives:  (ACP Link)Information on Advanced Care Planning can be found at [place]  Secretary of [facility] Advance Health Care Directives Advance Health Care Directives. [email or web address]   Advance Care Planning is important because it:  [x]  Makes sure you receive the medical care that is consistent with your values, goals, and preferences  [x]  It provides guidance to your family and loved ones and reduces their decisional burden about whether or not they are making the right decisions based on your wishes.  Follow the link provided in your after visit summary or read over the paperwork we have mailed to you to help you started getting your Advance Directives in place. If you need assistance in completing these, please reach out to us  so that we can help you!  See attachments for Preventive Care and Fall Prevention Tips.

## 2024-08-17 DIAGNOSIS — M5136 Other intervertebral disc degeneration, lumbar region with discogenic back pain only: Secondary | ICD-10-CM | POA: Diagnosis not present

## 2024-08-17 DIAGNOSIS — G629 Polyneuropathy, unspecified: Secondary | ICD-10-CM | POA: Diagnosis not present

## 2024-08-17 DIAGNOSIS — E1143 Type 2 diabetes mellitus with diabetic autonomic (poly)neuropathy: Secondary | ICD-10-CM | POA: Diagnosis not present

## 2024-08-19 ENCOUNTER — Other Ambulatory Visit: Payer: Self-pay | Admitting: Family Medicine

## 2024-08-19 DIAGNOSIS — F411 Generalized anxiety disorder: Secondary | ICD-10-CM

## 2024-08-20 NOTE — Telephone Encounter (Signed)
 Requested Prescriptions  Pending Prescriptions Disp Refills   citalopram  (CELEXA ) 40 MG tablet [Pharmacy Med Name: Citalopram  Hydrobromide 40 MG Oral Tablet] 90 tablet 0    Sig: Take 1 tablet by mouth once daily     Psychiatry:  Antidepressants - SSRI Passed - 08/20/2024  5:38 PM      Passed - Completed PHQ-2 or PHQ-9 in the last 360 days      Passed - Valid encounter within last 6 months    Recent Outpatient Visits           7 months ago Breast pain, left   [place] [facility] Aletha Bene, MD   10 months ago Bilateral lower extremity edema   [facility] Duanne Butler DASEN, MD   11 months ago Bilateral lower extremity edema   [facility] Duanne Butler DASEN, MD   12 months ago SOB (shortness of breath)   [place] [facility] Duanne Butler DASEN, MD   1 year ago Bilateral lower extremity edema   [place] [facility] Kayla Jeoffrey RAMAN, FNP

## 2024-08-24 ENCOUNTER — Ambulatory Visit (INDEPENDENT_AMBULATORY_CARE_PROVIDER_SITE_OTHER): Admitting: Family Medicine

## 2024-08-24 ENCOUNTER — Encounter: Payer: Self-pay | Admitting: Family Medicine

## 2024-08-24 VITALS — BP 136/82 | HR 88 | Temp 97.9°F | Ht 62.0 in | Wt 153.0 lb

## 2024-08-24 DIAGNOSIS — J432 Centrilobular emphysema: Secondary | ICD-10-CM | POA: Diagnosis not present

## 2024-08-24 DIAGNOSIS — G629 Polyneuropathy, unspecified: Secondary | ICD-10-CM | POA: Diagnosis not present

## 2024-08-24 DIAGNOSIS — R7303 Prediabetes: Secondary | ICD-10-CM | POA: Diagnosis not present

## 2024-08-24 DIAGNOSIS — F411 Generalized anxiety disorder: Secondary | ICD-10-CM | POA: Diagnosis not present

## 2024-08-24 DIAGNOSIS — I10 Essential (primary) hypertension: Secondary | ICD-10-CM

## 2024-08-24 MED ORDER — BUSPIRONE HCL 15 MG PO TABS: 15.0000 mg | ORAL_TABLET | Freq: Two times a day (BID) | ORAL | 3 refills | Status: AC

## 2024-08-24 MED ORDER — OZEMPIC (1 MG/DOSE) 4 MG/3ML ~~LOC~~ SOPN: 1.0000 mg | PEN_INJECTOR | SUBCUTANEOUS | 2 refills | Status: DC
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
## 2024-08-25 LAB — LIPID PANEL
Cholesterol: 189 mg/dL (ref <200)
HDL: 49 mg/dL — ABNORMAL LOW (ref >=50)
LDL Cholesterol (Calc): 113 mg/dL — ABNORMAL HIGH
Non-HDL Cholesterol (Calc): 140 mg/dL — ABNORMAL HIGH (ref <130)
Total CHOL/HDL Ratio: 3.9 (calc) (ref <5.0)
Triglycerides: 154 mg/dL — ABNORMAL HIGH (ref <150)

## 2024-08-25 LAB — CBC WITH DIFFERENTIAL/PLATELET
Absolute Lymphocytes: 2094 {cells}/uL (ref 850–3900)
Absolute Monocytes: 449 {cells}/uL (ref 200–950)
Basophils Absolute: 44 {cells}/uL (ref 0–200)
Basophils Relative: 0.5 %
Eosinophils Absolute: 238 {cells}/uL (ref 15–500)
Eosinophils Relative: 2.7 %
HCT: 46.4 % — ABNORMAL HIGH (ref 35.0–45.0)
Hemoglobin: 14.9 g/dL (ref 11.7–15.5)
MCH: 27.9 pg (ref 27.0–33.0)
MCHC: 32.1 g/dL (ref 32.0–36.0)
MCV: 86.7 fL (ref 80.0–100.0)
MPV: 10.8 fL (ref 7.5–12.5)
Monocytes Relative: 5.1 %
Neutro Abs: 5975 {cells}/uL (ref 1500–7800)
Neutrophils Relative %: 67.9 %
Platelets: 254 Thousand/uL (ref 140–400)
RBC: 5.35 Million/uL — ABNORMAL HIGH (ref 3.80–5.10)
RDW: 14.4 % (ref 11.0–15.0)
Total Lymphocyte: 23.8 %
WBC: 8.8 Thousand/uL (ref 3.8–10.8)

## 2024-08-25 LAB — COMPREHENSIVE METABOLIC PANEL WITH GFR
AG Ratio: 1.5 (calc) (ref 1.0–2.5)
ALT: 8 U/L (ref 6–29)
AST: 10 U/L (ref 10–35)
Albumin: 4.2 g/dL (ref 3.6–5.1)
Alkaline phosphatase (APISO): 62 U/L (ref 37–153)
BUN/Creatinine Ratio: 17 (calc) (ref 6–22)
BUN: 19 mg/dL (ref 7–25)
CO2: 27 mmol/L (ref 20–32)
Calcium: 9.1 mg/dL (ref 8.6–10.4)
Chloride: 104 mmol/L (ref 98–110)
Creat: 1.13 mg/dL — ABNORMAL HIGH (ref 0.50–1.05)
Globulin: 2.8 g/dL (ref 1.9–3.7)
Glucose, Bld: 87 mg/dL (ref 65–99)
Potassium: 4.4 mmol/L (ref 3.5–5.3)
Sodium: 140 mmol/L (ref 135–146)
Total Bilirubin: 0.3 mg/dL (ref 0.2–1.2)
Total Protein: 7 g/dL (ref 6.1–8.1)
eGFR: 54 mL/min/1.73m2 — ABNORMAL LOW (ref >=60)

## 2024-08-25 LAB — HEMOGLOBIN A1C
Hgb A1c MFr Bld: 5.6 % (ref <5.7)
Mean Plasma Glucose: 114 mg/dL
eAG (mmol/L): 6.3 mmol/L

## 2024-08-28 ENCOUNTER — Ambulatory Visit: Payer: Self-pay | Admitting: Family Medicine

## 2024-09-04 ENCOUNTER — Telehealth: Payer: Self-pay

## 2024-09-04 ENCOUNTER — Other Ambulatory Visit: Payer: Self-pay

## 2024-09-04 DIAGNOSIS — Z6836 Body mass index (BMI) 36.0-36.9, adult: Secondary | ICD-10-CM

## 2024-09-04 DIAGNOSIS — G629 Polyneuropathy, unspecified: Secondary | ICD-10-CM

## 2024-09-04 DIAGNOSIS — R7303 Prediabetes: Secondary | ICD-10-CM

## 2024-09-04 MED ORDER — SEMAGLUTIDE (2 MG/DOSE) 8 MG/3ML ~~LOC~~ SOPN: 2.0000 mg | PEN_INJECTOR | SUBCUTANEOUS | 3 refills | Status: DC

## 2024-09-04 NOTE — Telephone Encounter (Signed)
 Copied from CRM #[id number]. Topic: Clinical - Prescription Issue >> Sep 04, 2024 10:03 AM Pinkey ORN wrote: Reason for CRM: Semaglutide , 1 MG/DOSE, (OZEMPIC , 1 MG/DOSE,) 4 MG/3ML SOPN >> Sep 04, 2024 10:05 AM Pinkey ORN wrote: Patient is requesting this prescription be cancelled and requesting the dosage be increased to 2MG 

## 2024-09-14 DIAGNOSIS — M79671 Pain in right foot: Secondary | ICD-10-CM | POA: Diagnosis not present

## 2024-09-14 DIAGNOSIS — G629 Polyneuropathy, unspecified: Secondary | ICD-10-CM | POA: Diagnosis not present

## 2024-09-14 DIAGNOSIS — M5136 Other intervertebral disc degeneration, lumbar region with discogenic back pain only: Secondary | ICD-10-CM | POA: Diagnosis not present

## 2024-09-14 DIAGNOSIS — E1143 Type 2 diabetes mellitus with diabetic autonomic (poly)neuropathy: Secondary | ICD-10-CM | POA: Diagnosis not present

## 2024-10-01 DIAGNOSIS — H35373 Puckering of macula, bilateral: Secondary | ICD-10-CM | POA: Diagnosis not present

## 2024-10-05 ENCOUNTER — Other Ambulatory Visit: Payer: Self-pay | Admitting: Family Medicine

## 2024-10-08 ENCOUNTER — Telehealth: Payer: Self-pay

## 2024-10-08 ENCOUNTER — Other Ambulatory Visit: Payer: Self-pay

## 2024-10-08 DIAGNOSIS — I10 Essential (primary) hypertension: Secondary | ICD-10-CM

## 2024-10-08 MED ORDER — LOSARTAN POTASSIUM 100 MG PO TABS: 100.0000 mg | ORAL_TABLET | Freq: Every day | ORAL | 1 refills | Status: AC

## 2024-10-08 NOTE — Telephone Encounter (Signed)
 Prescription Request  10/08/2024  LOV: 01/16/24  What is the name of the medication or equipment? losartan  (COZAAR ) 100 MG tablet [[id number]]   Have you contacted your pharmacy to request a refill? Yes   Which pharmacy would you like this sent to?  [facility] [id number] [place], [place] - [address] [facility] [address] [place] [address] Phone: [phone] Fax: [phone]    Patient notified that their request is being sent to the clinical staff for review and that they should receive a response within 2 business days.   Please advise at [facility] [phone]

## 2024-10-12 DIAGNOSIS — E1143 Type 2 diabetes mellitus with diabetic autonomic (poly)neuropathy: Secondary | ICD-10-CM | POA: Diagnosis not present

## 2024-10-12 DIAGNOSIS — M5136 Other intervertebral disc degeneration, lumbar region with discogenic back pain only: Secondary | ICD-10-CM | POA: Diagnosis not present

## 2024-10-12 DIAGNOSIS — G629 Polyneuropathy, unspecified: Secondary | ICD-10-CM | POA: Diagnosis not present

## 2024-11-02 ENCOUNTER — Other Ambulatory Visit: Payer: Self-pay | Admitting: Family Medicine

## 2024-11-02 DIAGNOSIS — K219 Gastro-esophageal reflux disease without esophagitis: Secondary | ICD-10-CM

## 2024-11-02 DIAGNOSIS — R5383 Other fatigue: Secondary | ICD-10-CM

## 2024-11-03 NOTE — Telephone Encounter (Signed)
 Requested Prescriptions  Pending Prescriptions Disp Refills   pantoprazole  (PROTONIX ) 40 MG tablet [Pharmacy Med Name: Pantoprazole  Sodium 40 MG Oral Tablet Delayed Release] 90 tablet 2    Sig: Take 1 tablet by mouth once daily     Gastroenterology: Proton Pump Inhibitors Passed - 11/03/2024  8:05 AM      Passed - Valid encounter within last 12 months    Recent Outpatient Visits           2 months ago Generalized anxiety disorder   [place] Duanne Butler DASEN, MD   9 months ago Breast pain, left   [facility] Aletha Bene, MD   1 year ago Bilateral lower extremity edema   [place] [facility] Duanne Butler DASEN, MD   1 year ago Bilateral lower extremity edema   [place] [facility] Duanne Butler DASEN, MD   1 year ago SOB (shortness of breath)   [place] [facility] [place], Butler DASEN, MD

## 2024-11-14 ENCOUNTER — Other Ambulatory Visit: Payer: Self-pay | Admitting: Family Medicine

## 2024-11-14 DIAGNOSIS — G6289 Other specified polyneuropathies: Secondary | ICD-10-CM

## 2024-11-26 ENCOUNTER — Other Ambulatory Visit: Payer: Self-pay | Admitting: Family Medicine

## 2024-11-26 DIAGNOSIS — F411 Generalized anxiety disorder: Secondary | ICD-10-CM

## 2024-12-19 ENCOUNTER — Other Ambulatory Visit (HOSPITAL_COMMUNITY): Payer: Self-pay

## 2024-12-21 ENCOUNTER — Ambulatory Visit: Payer: Self-pay

## 2024-12-21 NOTE — Telephone Encounter (Signed)
 Pt c/o sternal CP and chest pressure x 3 days, advised to go to ED, pt refused. Pt told to call back or call 911 if anything changes/worsens.    FYI Only or Action Required?: Action required by provider: update on patient condition.  Patient was last seen in primary care on 08/24/2024 by Duanne Butler DASEN, MD.  Called Nurse Triage reporting Chest Pain.  Symptoms began several days ago.  Interventions attempted: Nothing.  Symptoms are: gradually worsening.  Triage Disposition: Call EMS 911 Now  Patient/caregiver understands and will follow disposition?: No, refuses disposition  Copied from CRM #[id number]. Topic: Clinical - Red Word Triage >> Dec 21, 2024  3:44 PM Joesph NOVAK wrote: Red Word that prompted transfer to Nurse Triage: chest pain Reason for Disposition  [1] Chest pain lasts > 5 minutes AND [2] age > 65  [1] Chest pain lasts > 5 minutes AND [2] age > 30 AND [3] one or more cardiac risk factors (e.g., diabetes, high blood pressure, high cholesterol, obesity with BMI 30 or higher, smoker, or strong family history of heart disease)  Answer Assessment - Initial Assessment Questions 1. LOCATION: Where does it hurt?       Sternal CP  2. RADIATION: Does the pain go anywhere else? (e.g., into neck, jaw, arms, back)     Denies  3. ONSET: When did the chest pain begin? (Minutes, hours or days)      3 days ago  4. PATTERN: Does the pain come and go, or has it been constant since it started?  Does it get worse with exertion?      CP mainly occurs in the evening, intermittent  5. DURATION: How long does it last (e.g., seconds, minutes, hours)     Lasts 1-1.5 hr at a time  6. SEVERITY: How bad is the pain?  (e.g., Scale 1-10; mild, moderate, or severe)     7/10, feels like pressure  7. CARDIAC RISK FACTORS: Do you have any history of heart problems or risk factors for heart disease? (e.g., angina, prior heart attack; diabetes, high blood pressure, high cholesterol,  smoker, or strong family history of heart disease)     HTN  8. PULMONARY RISK FACTORS: Do you have any history of lung disease?  (e.g., blood clots in lung, asthma, emphysema, birth control pills)     Denies  9. CAUSE: What do you think is causing the chest pain?     Pt unsure, however she reports I've been real nervous over the holidays  10. OTHER SYMPTOMS: Do you have any other symptoms? (e.g., dizziness, nausea, vomiting, sweating, fever, difficulty breathing, cough) Denies  Protocols used: Chest Pain-A-AH

## 2024-12-24 ENCOUNTER — Other Ambulatory Visit: Payer: Self-pay | Admitting: Family Medicine

## 2024-12-24 DIAGNOSIS — R7303 Prediabetes: Secondary | ICD-10-CM

## 2024-12-24 DIAGNOSIS — Z6836 Body mass index (BMI) 36.0-36.9, adult: Secondary | ICD-10-CM

## 2024-12-24 DIAGNOSIS — G629 Polyneuropathy, unspecified: Secondary | ICD-10-CM

## 2024-12-25 NOTE — Telephone Encounter (Signed)
 Copied from CRM [id number]. Topic: General - Other >> Dec 25, 2024  8:12 AM Eva FALCON wrote: Reason for CRM: Pt was calling to check on status of her medication refill. Advise its pending approval from provider, did advise 3 day turn over. Just trying to refill before insurance starts. Please call [phone] once it has been sent.

## 2024-12-25 NOTE — Telephone Encounter (Signed)
 Pt informed that she needs to go to the ER. I informed her that the earliest appt we have is on Monday and that is too long to wait for chest pain. Pt did not say whether she would go or not but I strongly encouraged her.

## 2024-12-26 ENCOUNTER — Other Ambulatory Visit (HOSPITAL_COMMUNITY): Payer: Self-pay

## 2025-01-09 ENCOUNTER — Ambulatory Visit: Payer: Self-pay

## 2025-01-09 NOTE — Telephone Encounter (Signed)
 FYI Only or Action Required?: FYI only for provider: ED advised.  Patient was last seen in primary care on 08/24/2024 by Duanne Butler DASEN, MD.  Called Nurse Triage reporting Hypertension.  Symptoms began several weeks ago.  Interventions attempted: Prescription medications: doubling losartan , furosemide .  Symptoms are: gradually worsening.  Triage Disposition: Go to ED Now (Notify PCP)  Patient/caregiver understands and will follow disposition?: No, wishes to speak with PCP          Copied from CRM [id number]. Topic: Clinical - Red Word Triage >> Jan 09, 2025  2:19 PM Makayla Lin wrote: Red Word that prompted transfer to Nurse Triage: bp 163/100, 171/100, 156/96 Pt has been doubling up on bp meds but it is still not bringing it down. Reason for Disposition  [1] Systolic BP >= 160 OR Diastolic >= 100 AND [2] cardiac (e.g., breathing difficulty, chest pain) or neurologic symptoms (e.g., new-onset blurred or double vision, unsteady gait)  Answer Assessment - Initial Assessment Questions 1. BLOOD PRESSURE: What is your blood pressure? Did you take at least two measurements 5 minutes apart?     163/100 today. Patient states that was after doubling up on her losartan  and taking her 40mg  furosemide  with no missed doses. No difficulty breathing. Intermittent chest pain, last episode on Monday and duration is all morning or all evening it lasts. Not taking hydrochlorothiazide .  Unsure how long her BP has been running this high, BP readings yesterday: 171/100 and 156/96.  Stopped triage and advised ED. Patient also called in 2.5 weeks ago c/o chest pain and was told by RN and PCP clinic to go to ED. She states she did not go and instead has been taking baby aspirin for the chest pain. Called [facility] and notified staff member Nanette of ED refusal.  Protocols used: Blood Pressure - High-A-AH

## 2025-01-15 ENCOUNTER — Ambulatory Visit: Admitting: Family Medicine

## 2025-01-15 ENCOUNTER — Encounter: Payer: Self-pay | Admitting: Family Medicine

## 2025-01-15 DIAGNOSIS — I10 Essential (primary) hypertension: Secondary | ICD-10-CM | POA: Diagnosis not present

## 2025-01-15 MED ORDER — NEBIVOLOL HCL 5 MG PO TABS: 5.0000 mg | ORAL_TABLET | Freq: Every day | ORAL | 3 refills | Status: AC

## 2025-01-15 MED ORDER — HYDROXYZINE PAMOATE 50 MG PO CAPS: 50.0000 mg | ORAL_CAPSULE | Freq: Three times a day (TID) | ORAL | 3 refills | Status: AC | PRN
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
## 2025-01-29 ENCOUNTER — Ambulatory Visit: Admitting: Family Medicine

## 2025-01-30 ENCOUNTER — Other Ambulatory Visit: Payer: Self-pay | Admitting: Family Medicine

## 2025-01-30 DIAGNOSIS — R5383 Other fatigue: Secondary | ICD-10-CM

## 2025-01-30 DIAGNOSIS — K219 Gastro-esophageal reflux disease without esophagitis: Secondary | ICD-10-CM

## 2025-08-21 ENCOUNTER — Ambulatory Visit
